# Patient Record
Sex: Male | Born: 1955 | Race: White | Hispanic: No | Marital: Married | Smoking: Current every day smoker
Health system: Southern US, Community
[De-identification: ages and names within clinical notes are randomized; demographics above are authoritative.]

## PROBLEM LIST (undated history)

## (undated) DIAGNOSIS — R0602 Shortness of breath: Secondary | ICD-10-CM

## (undated) DIAGNOSIS — G459 Transient cerebral ischemic attack, unspecified: Secondary | ICD-10-CM

## (undated) DIAGNOSIS — I219 Acute myocardial infarction, unspecified: Secondary | ICD-10-CM

## (undated) DIAGNOSIS — I639 Cerebral infarction, unspecified: Secondary | ICD-10-CM

## (undated) HISTORY — PX: OTHER SURGICAL HISTORY: SHX169

## (undated) HISTORY — DX: Transient cerebral ischemic attack, unspecified: G45.9

## (undated) HISTORY — PX: CORONARY ANGIOPLASTY WITH STENT PLACEMENT: SHX49

## (undated) HISTORY — DX: Cerebral infarction, unspecified: I63.9

## (undated) HISTORY — DX: Shortness of breath: R06.02

## (undated) HISTORY — DX: Acute myocardial infarction, unspecified: I21.9

## (undated) HISTORY — PX: CAROTID STENT: SHX1301

## (undated) HISTORY — PX: CATARACT EXTRACTION: SUR2

---

## 2004-05-18 ENCOUNTER — Ambulatory Visit: Payer: Self-pay | Admitting: Cardiology

## 2004-05-18 ENCOUNTER — Inpatient Hospital Stay (HOSPITAL_COMMUNITY): Admission: AD | Admit: 2004-05-18 | Discharge: 2004-05-21 | Payer: Self-pay | Admitting: Cardiology

## 2005-12-17 ENCOUNTER — Encounter: Admission: RE | Admit: 2005-12-17 | Discharge: 2005-12-17 | Payer: Self-pay | Admitting: Orthopaedic Surgery

## 2006-01-06 ENCOUNTER — Encounter: Admission: RE | Admit: 2006-01-06 | Discharge: 2006-01-06 | Payer: Self-pay | Admitting: Orthopaedic Surgery

## 2006-10-19 ENCOUNTER — Ambulatory Visit: Payer: Self-pay | Admitting: Vascular Surgery

## 2010-11-13 NOTE — Cardiovascular Report (Signed)
NAME:  Eddie Jones, Eddie Jones NO.:  [id number]   MEDICAL RECORD NO.:  [id number]          PATIENT TYPE:  INP   LOCATION:  2920                         FACILITY:  [facility]   PHYSICIAN:  Arturo Morton. Riley Kill, M.D. [facility] OF BIRTH:  1955/08/11   DATE OF PROCEDURE:  05/19/2004  DATE OF DISCHARGE:                              CARDIAC CATHETERIZATION   INDICATIONS FOR PROCEDURE:  [name] is a 55 year old gentleman who has had  a prior CVA.  He presented with a non-ST elevation myocardial infarction.  The EKG was not definitive.  There was some ST elevation in V3 through V5,  but it appears that the limb leads were not with normal precordial  progression.  Nonetheless, enzymes were positive and he was brought to the  laboratory for further evaluation.  He previously underwent catheterization  which apparently demonstrated a 99% distal diagonal and a small vessel, a  30% ramus, a 75% proximal circumflex, a 50% mid right, and a 30% PDA.  He  now presents with ongoing chest pain.  The patient is on a variety of  medications including Lopressor, Ecotrin, Protonix, Norvasc, Plavix, and  Lipitor.   PROCEDURE:  1.  Left heart catheterization.  2.  Selective coronary arteriography.  3.  Selective left ventriculography.  4.  Percutaneous transluminal coronary angioplasty and stenting of the      diagonal.  5.  Stenting of the left anterior descending.   DESCRIPTION OF PROCEDURE:  The patient was brought to the catheterization  laboratory and prepped and draped in the usual fashion.  Through an anterior  puncture the right femoral artery was entered.  A 6 French sheath was  placed.  Views of the right and left coronary arteries were obtained in  multiple angiographic projections.  Central aortic and left ventricular  pressures were measured with pigtail.  Ventriculography was performed in the  RAO projection.  Ventriculography was also performed in the LAO projection.  I then called Dr.  Loraine Leriche Pulsipher and he came over to the laboratory and we  reviewed the films carefully.  The AV circumflex was not a favorable vessel  for intervention, and it was really quite small.  There was a sub-branch of  the diagonal that was subtotally occluded and leading into this bifurcation  was a fairly high-grade lesion.  There was a slightly hazy lesion in the mid  left anterior descending artery about 70% as well.  The RCA has about 50%  mid-narrowing.  After review of the films, Dr. Gerri Spore and I felt that we  should try to open the diagonal.  It was also felt that the LAD should be  stented.  The patient was on an Integrilin drip and was given heparin to  prolong the ACT to over 200.  I spoke with the patient's family and with the  patient about potential options, and we decided to proceed on.  Risks were  reviewed.  We used a 7 [place] sheath and chose a 7 [place] JL35 Cortis guide.  We tried to get into the medial branch of the sub-branch of the  diagonal,  but were unable to do so, but were able to get a wire down the larger of the  two branches.  We then pre-dilated with a 1.5 and then subsequently with a 2  mm balloon.  There was some improvement, but the vessel developed some re-  narrowing.  We did some long inflations and this improved the appearance of  the artery.  Attention was then turned to the LAD, and we directly stented  that with a 2.5 x 18 Cypher drug-eluding stent.  There was marked  improvement of the appearance of the artery, and then we post-dilated with a  3 mm Power Sail throughout the course of the stent.  Because there appeared  to be some re-narrowing in the diagonal, we recrossed into the diagonal.  Multiple attempts to get into the second sub-branch were unsuccessful  because of the marked angulation and there was slow flow into this to begin  with, and this was the previously described vessel.  We re-dilated, but the  vessel still did not look good, so we  elected to go ahead and place a 2 mm  stent across this area, and the 2 mm stent resulted in a marked improvement  in the appearance of the artery.  The 2 mm miny vision was taken up to about  11 atmospheres.  Because this was over-sized, it was not post-dilated.  All  catheters were subsequently removed.  Because of some oozing around the  sheath, the 7 French sheath was upgraded to an 8 [place] sheath.  ACT's were  checked throughout the course of the procedure and were satisfactory.  Intermittent heparin boluses were given to maintain an ACT between 200 and  300 seconds on eptifibatide.  The procedure was completed.  The patient was  taken to the holding area in satisfactory clinical condition.   HEMODYNAMIC DATA:  1.  Central aorta 168/100, mean 126.  2.  Left ventricle 165/33.  3.  No gradient on pull-back across the aortic valve.   ANGIOGRAPHIC DATA:  1.  Ventriculography was performed in the RAO projection.  Overall systolic      function was preserved.  In the LAO projection likewise.  No definite      wall motion abnormality was seen.  2.  The left main demonstrates about 20% ostial narrowing, but this does not      appear to be at all high-grade.  3.  The left anterior descending courses to the apex after the major      diagonal take-off, there is an area of haziness with 70% segmental      plaquing.  In some views, it looks perhaps like it could be tighter,      although it is not clear that this represents the culprit.  Following      stenting with a Cypher stent this was reduced to 0%.  The Cypher stent      was post-dilated with a 3 mm Power Sail balloon up to about 12 to 14      atmospheres, so therefore there should be an excellent angiographic      appearance.  The distal left anterior descending was widely patent.  4.  The proximal diagonal has diffuse 30 to 40% narrowing throughout.  It      then bifurcates into a larger branch that has about a 50% area of      narrowing.  The next branch then also bifurcates again into a sub-branch  that is subtotally occluded and another branch that has about 80 to 90%      narrowing as well.  We stented across the larger branch, was stented      across the more severely diseased branch with reduction stenosis from      about 80% to 0%.  This appeared excellent.  5.  There is a large ramus intermedius vessel that has about 30% mid-      narrowing.  There is an AV circumflex that supplies a very small amount      of lateral myocardium with diffuse 80% narrowing.  There is a steep      angulation from the left main making this unfavorable for intervention.  6.  The right coronary artery is a fairly large caliber vessel with about a      50% area of mid-narrowing.  The posterior descending artery and      posterior lateral branch without critical disease.   CONCLUSION:  1.  Well preserved left ventricular function.  2.  Non-ST elevation myocardial infarction.  3.  Successful dilatation of a diagonal sub-branch without change in the      subtotally occluded second sub-branch which we did not cross.  4.  Successful stenting of the left anterior descending artery with a drug-      eluding stent as described above.   DISPOSITION:  The patient does have residual disease.  He smokes.  He also  has very poor dentition.  All of these things will need to be taken care of.  He will need to maintain treatment with statin's as well as aspirin and  Plavix.  Close followup with Dr. Tomie China will be recommended.       TDS/MEDQ  D:  05/19/2004  T:  05/19/2004  Job:  [id number]   cc:   [place] Bing, M.D.   Aundra Dubin. Revankar, M.D.  [address]  [place]  [place] [address]  Fax: [phone]   [facility]

## 2010-11-13 NOTE — H&P (Signed)
NAME:  TIANDRE, TEALL NO.:  [id number]   MEDICAL RECORD NO.:  [id number]          PATIENT TYPE:  INP   LOCATION:  2920                         FACILITY:  [facility]   PHYSICIAN:  [place] Bing, M.D.  DATE OF BIRTH:  10/29/1955   DATE OF ADMISSION:  05/18/2004  DATE OF DISCHARGE:                                HISTORY & PHYSICAL   REFERRING PHYSICIAN:  Dr. Aundra Dubin. Revankar   HISTORY OF PRESENT ILLNESS:  A 55 year old gentleman with relatively mild  coronary disease with catheterization in July 2002 and now presents with  severe chest discomfort with ischemic quality EKG abnormalities. Mr. Mcdonagh  has a history of hypertension that has been apparently been relatively well  controlled. He is cigarette smoker with current consumption of two packs per  day. He suffered a CVA approximately three years ago resulting in transient  right-sided weakness and numbness as well as dysarthria. His dominant hand  is his left. He has done well with occasional sharp transitory chest  discomfort since his previous catheterization until approximately 2 p.m.  today when he experienced the sudden onset at rest of severe pressure  radiating to the left arm. This has gradually increased in intensity since  he sought medical attention some hours ago, currently at a level of 1/10.  He had associated, but no diaphoresis nor dyspnea. He was seen at [facility] were two EKGs were relatively unremarkable, but the third showed  anteroseptal ST-segment elevation with loss of R-waves. On a repeat tracing  here, his EKG is back to baseline.   Cardiac catheterization performed at [facility] on January 04, 2004,  revealed normal left ventricular function. There was a 99% stenosis in the  small branch of the first diagonal vessel as well as a 75% proximal  circumflex lesion, 30% lesion in a ramus, 50% mid RCA lesion, and a 30% PDA  stenosis.   PAST MEDICAL HISTORY:  Otherwise unremarkable.  The patient reports no  allergies. His recent medications have included atenolol, amlodipine,  clopidogrel, and aspirin.   SOCIAL HISTORY:  Lives in [place]; married; works as a Soil scientist. A 60-pack-  year history of cigarette smoking. Denies excessive use of alcohol.   FAMILY HISTORY:  Mother died of myocardial infarction at age 36.  He has  three brothers who have undergone CABG surgery.   REVIEW OF SYSTEMS:  Notable for low back pain.  He has occasional headaches.  Otherwise, all systems are reviewed and are negative.   PHYSICAL EXAMINATION:  GENERAL: A pleasant gentleman with somewhat difficult  to understand speech, in no acute distress.  VITAL SIGNS: The heart rate is 72 and regular, respirations 16, blood  pressure 150/80. O2 saturation 100% on two liters.  HEENT: Anicteric sclera; normal lids and conjunctiva; pupils equal, round,  and reactive to light; EOMs full.  NECK:  No jugular venous distention; no carotid bruits.  ENDOCRINE: No thyromegaly.  HEMATOPOIETIC: No cervical, axillary, or inguinal adenopathy.  LUNGS: A few scattered rhonchi; coarse breath sounds at both bases.  CARDIAC: Normal first heart sound. Increased aortic component in  the second  heart sound; minimal systolic murmur; soft fourth heart sound present.  ABDOMEN: Soft and nontender; no organomegaly; normal bowel sounds; no  bruits; aortic pulsations not palpable.  NEUROMUSCULAR: Symmetric strength and tone; normal cranial nerves; normal  HIF.  MUSCULOSKELETAL: No joint deformities.   Chest x-ray: Under penetrated; no abnormalities detected by report.   EKG: At present, normal sinus rhythm; J-point elevation in V2 and V3, which  is unchanged from a prior tracing.  Prominent voltage; otherwise normal.   LABORATORY DATA:  Other laboratory notable for white count of 12,500, normal  renal function, glucose 141, normal initial markers.   IMPRESSION:  A 55 year old gentleman with no coronary disease  presenting  with impressive symptoms and at least one EKG consistent with transmural  ischemia. Initial treatment will be with continuation of beta blocker,  aspirin, and clopidogrel. He has already received Lovenox, which will also  be continued. Integrilin will be added. Unless he has recurrent symptoms,  coronary angiography will be performed tomorrow. We will attempt to  encourage him to stop smoking. Lipid profile will be assessed. Hypertension  will be optimally controlled.      [place]   RR/MEDQ  D:  05/18/2004  T:  05/18/2004  Job:  [id number]

## 2010-11-13 NOTE — Discharge Summary (Signed)
NAME:  Eddie Jones, Eddie Jones NO.:  [id number]   MEDICAL RECORD NO.:  [id number]          PATIENT TYPE:  INP   LOCATION:  2015                         FACILITY:  [facility]   PHYSICIAN:  [place] Bing, M.D.  DATE OF BIRTH:  05-28-1956   DATE OF ADMISSION:  05/18/2004  DATE OF DISCHARGE:  05/21/2004                           DISCHARGE SUMMARY - REFERRING   DISCHARGE DIAGNOSES:  1.  Non ST-segment elevated myocardial infarction status post percutaneous      coronary intervention with a drug-eluding stent to the left anterior      descending and status post percutaneous coronary intervention with a      bare-metal stent to the diagonal.  2.  Hypertension, treated.  3.  Hyperlipidemia, treated.  4.  History of cerebrovascular accident in 2002.  5.  Diabetes mellitus, dietary recommendations given to the patient.  6.  Nonsustained ventricular tachycardia, resolved.   HOSPITAL COURSE:  Eddie Jones is a 55 year old male patient who developed  substernal chest pain at 2 p.m. on the day of admission.  The chest pain did  radiate into his left arm.  He did have seral EKGs and the third EKG  revealed an anteroseptal ST-segment elevation with loss of R-waves but a  repeat tracing upon arrival at [facility] revealed that this had  resolved.  The patient again was transferred to [facility] from [facility] and ultimately underwent an emergent cardiac catheterization for  recurrent chest pain.  He was found to have a 70% LAD lesion and a drug-  eluding stent was placed to that lesion.  He also had a 90% diagonal  stenosis and a nondrug-eluding stent was placed into this small vessel.  The  diagonal was stented across a severely diseased subbranch and no EKG changes  occurred.  A small AV circumflex had an 80% lesion while the mid circumflex  had an 30% stenosis.  These areas were treated medically.  There was a 50%  RCA lesion as well.   During the patient's  hospitalization, he also underwent carotid Dopplers  which revealed no right ICA stenosis.  There was a left 60-80% ICA stenosis  and this was felt to be on the lower end of the scale as far as stenoses.   The patient was treated aggressively with medical therapy.  Lab studies  during the stay revealed a total cholesterol of 219, triglycerides 207, HDL  27, LDL 152.  BUN 5, creatinine 0.9, potassium 3.6, sodium 136, white count  11.6, hemoglobin 13, hematocrit 38.3, platelets 298, hemoglobin A1C 6.3  (normal range 4.6 to 6.1), maximum CK was 374 with 28 MB fractions.  Maximum  troponin was 5.24.   The patient was discharged to home on May 22, 2004 in a sable condition  on the following medications:  Toprol-XL 100 mg a day, chlorthalidone 12.5  mg a day, coated aspirin 325 mg a day, Norvasc 10 mg a day, Plavix 75 mg a  day, Lipitor 40 mg q.h.s., Wellbutrin 150 mg tablets daily until May 23, 2004 and then he goes on a b.i.d.  dose.  May utilize sublingual  nitroglycerin p.r.n. chest pain.  No straining or lifting over 10 pounds for  1 week.  Remain on a low fat, diabetic diet.  No work until cleared by Dr. Tomie China.  No smoking.  Clean over cath site with soap and water, no scrubbing.  Call  for questions or concerns at [phone].  We have asked the patient to follow  up with Dr. Tomie China and to make an appointment to see him in 2 weeks.      Larita Fife   LB/MEDQ  D:  05/21/2004  T:  05/21/2004  Job:  [id number]   cc:   Aundra Dubin. Revankar, M.D.  [address]  [place]  [place] [address]  Fax: [phone]   Dr. Beecher Mcardle

## 2011-08-05 DIAGNOSIS — E119 Type 2 diabetes mellitus without complications: Secondary | ICD-10-CM | POA: Diagnosis not present

## 2011-08-05 DIAGNOSIS — Z79899 Other long term (current) drug therapy: Secondary | ICD-10-CM | POA: Diagnosis not present

## 2011-08-05 DIAGNOSIS — I2510 Atherosclerotic heart disease of native coronary artery without angina pectoris: Secondary | ICD-10-CM | POA: Diagnosis not present

## 2011-08-05 DIAGNOSIS — E785 Hyperlipidemia, unspecified: Secondary | ICD-10-CM | POA: Diagnosis not present

## 2011-08-05 DIAGNOSIS — I10 Essential (primary) hypertension: Secondary | ICD-10-CM | POA: Diagnosis not present

## 2011-08-05 DIAGNOSIS — Z6836 Body mass index (BMI) 36.0-36.9, adult: Secondary | ICD-10-CM | POA: Diagnosis not present

## 2011-09-16 DIAGNOSIS — R0602 Shortness of breath: Secondary | ICD-10-CM | POA: Diagnosis not present

## 2011-09-16 DIAGNOSIS — R079 Chest pain, unspecified: Secondary | ICD-10-CM | POA: Diagnosis not present

## 2011-09-16 DIAGNOSIS — E785 Hyperlipidemia, unspecified: Secondary | ICD-10-CM | POA: Diagnosis not present

## 2011-09-16 DIAGNOSIS — J449 Chronic obstructive pulmonary disease, unspecified: Secondary | ICD-10-CM | POA: Diagnosis not present

## 2011-09-16 DIAGNOSIS — I10 Essential (primary) hypertension: Secondary | ICD-10-CM | POA: Diagnosis not present

## 2011-09-16 DIAGNOSIS — F17200 Nicotine dependence, unspecified, uncomplicated: Secondary | ICD-10-CM | POA: Diagnosis not present

## 2011-09-16 DIAGNOSIS — I252 Old myocardial infarction: Secondary | ICD-10-CM | POA: Diagnosis not present

## 2011-09-16 DIAGNOSIS — Z8673 Personal history of transient ischemic attack (TIA), and cerebral infarction without residual deficits: Secondary | ICD-10-CM | POA: Diagnosis not present

## 2011-09-22 DIAGNOSIS — E119 Type 2 diabetes mellitus without complications: Secondary | ICD-10-CM | POA: Diagnosis not present

## 2011-09-22 DIAGNOSIS — I739 Peripheral vascular disease, unspecified: Secondary | ICD-10-CM | POA: Diagnosis not present

## 2011-09-22 DIAGNOSIS — I10 Essential (primary) hypertension: Secondary | ICD-10-CM | POA: Diagnosis not present

## 2011-09-22 DIAGNOSIS — I209 Angina pectoris, unspecified: Secondary | ICD-10-CM | POA: Diagnosis not present

## 2011-09-23 DIAGNOSIS — M545 Low back pain: Secondary | ICD-10-CM | POA: Diagnosis not present

## 2011-09-23 DIAGNOSIS — G894 Chronic pain syndrome: Secondary | ICD-10-CM | POA: Diagnosis not present

## 2011-11-05 DIAGNOSIS — I6529 Occlusion and stenosis of unspecified carotid artery: Secondary | ICD-10-CM | POA: Diagnosis not present

## 2011-11-10 DIAGNOSIS — I10 Essential (primary) hypertension: Secondary | ICD-10-CM | POA: Diagnosis not present

## 2011-11-10 DIAGNOSIS — E785 Hyperlipidemia, unspecified: Secondary | ICD-10-CM | POA: Diagnosis not present

## 2011-11-10 DIAGNOSIS — E119 Type 2 diabetes mellitus without complications: Secondary | ICD-10-CM | POA: Diagnosis not present

## 2011-11-10 DIAGNOSIS — Z6835 Body mass index (BMI) 35.0-35.9, adult: Secondary | ICD-10-CM | POA: Diagnosis not present

## 2011-12-06 DIAGNOSIS — G894 Chronic pain syndrome: Secondary | ICD-10-CM | POA: Diagnosis not present

## 2012-01-04 DIAGNOSIS — F17200 Nicotine dependence, unspecified, uncomplicated: Secondary | ICD-10-CM | POA: Diagnosis not present

## 2012-01-04 DIAGNOSIS — I6529 Occlusion and stenosis of unspecified carotid artery: Secondary | ICD-10-CM | POA: Diagnosis not present

## 2012-01-06 DIAGNOSIS — M545 Low back pain: Secondary | ICD-10-CM | POA: Diagnosis not present

## 2012-01-06 DIAGNOSIS — G894 Chronic pain syndrome: Secondary | ICD-10-CM | POA: Diagnosis not present

## 2012-05-08 DIAGNOSIS — I658 Occlusion and stenosis of other precerebral arteries: Secondary | ICD-10-CM | POA: Diagnosis not present

## 2012-05-11 DIAGNOSIS — G894 Chronic pain syndrome: Secondary | ICD-10-CM | POA: Diagnosis not present

## 2012-05-11 DIAGNOSIS — M545 Low back pain: Secondary | ICD-10-CM | POA: Diagnosis not present

## 2012-06-14 DIAGNOSIS — E119 Type 2 diabetes mellitus without complications: Secondary | ICD-10-CM | POA: Diagnosis not present

## 2012-06-14 DIAGNOSIS — Z79899 Other long term (current) drug therapy: Secondary | ICD-10-CM | POA: Diagnosis not present

## 2012-06-14 DIAGNOSIS — R079 Chest pain, unspecified: Secondary | ICD-10-CM | POA: Diagnosis not present

## 2012-06-14 DIAGNOSIS — E785 Hyperlipidemia, unspecified: Secondary | ICD-10-CM | POA: Diagnosis not present

## 2012-06-14 DIAGNOSIS — I6529 Occlusion and stenosis of unspecified carotid artery: Secondary | ICD-10-CM | POA: Diagnosis not present

## 2012-06-14 DIAGNOSIS — I10 Essential (primary) hypertension: Secondary | ICD-10-CM | POA: Diagnosis not present

## 2012-06-14 DIAGNOSIS — G894 Chronic pain syndrome: Secondary | ICD-10-CM | POA: Diagnosis not present

## 2012-06-14 DIAGNOSIS — I2510 Atherosclerotic heart disease of native coronary artery without angina pectoris: Secondary | ICD-10-CM | POA: Diagnosis not present

## 2012-06-16 DIAGNOSIS — F411 Generalized anxiety disorder: Secondary | ICD-10-CM | POA: Diagnosis not present

## 2012-06-16 DIAGNOSIS — I10 Essential (primary) hypertension: Secondary | ICD-10-CM | POA: Diagnosis not present

## 2012-06-16 DIAGNOSIS — I2510 Atherosclerotic heart disease of native coronary artery without angina pectoris: Secondary | ICD-10-CM | POA: Diagnosis not present

## 2012-06-16 DIAGNOSIS — E119 Type 2 diabetes mellitus without complications: Secondary | ICD-10-CM | POA: Diagnosis not present

## 2012-06-30 DIAGNOSIS — G894 Chronic pain syndrome: Secondary | ICD-10-CM | POA: Diagnosis not present

## 2012-07-12 DIAGNOSIS — I2510 Atherosclerotic heart disease of native coronary artery without angina pectoris: Secondary | ICD-10-CM | POA: Diagnosis not present

## 2012-07-12 DIAGNOSIS — E119 Type 2 diabetes mellitus without complications: Secondary | ICD-10-CM | POA: Diagnosis not present

## 2012-07-12 DIAGNOSIS — E785 Hyperlipidemia, unspecified: Secondary | ICD-10-CM | POA: Diagnosis not present

## 2012-09-18 DIAGNOSIS — G894 Chronic pain syndrome: Secondary | ICD-10-CM | POA: Diagnosis not present

## 2012-09-18 DIAGNOSIS — M545 Low back pain: Secondary | ICD-10-CM | POA: Diagnosis not present

## 2012-09-21 DIAGNOSIS — M48061 Spinal stenosis, lumbar region without neurogenic claudication: Secondary | ICD-10-CM | POA: Diagnosis not present

## 2012-09-21 DIAGNOSIS — M545 Low back pain: Secondary | ICD-10-CM | POA: Diagnosis not present

## 2012-09-21 DIAGNOSIS — M5126 Other intervertebral disc displacement, lumbar region: Secondary | ICD-10-CM | POA: Diagnosis not present

## 2012-10-03 DIAGNOSIS — E785 Hyperlipidemia, unspecified: Secondary | ICD-10-CM | POA: Diagnosis not present

## 2012-10-03 DIAGNOSIS — M5380 Other specified dorsopathies, site unspecified: Secondary | ICD-10-CM | POA: Diagnosis not present

## 2012-10-03 DIAGNOSIS — E119 Type 2 diabetes mellitus without complications: Secondary | ICD-10-CM | POA: Diagnosis not present

## 2012-10-03 DIAGNOSIS — I10 Essential (primary) hypertension: Secondary | ICD-10-CM | POA: Diagnosis not present

## 2012-10-17 DIAGNOSIS — M48061 Spinal stenosis, lumbar region without neurogenic claudication: Secondary | ICD-10-CM | POA: Diagnosis not present

## 2012-10-17 DIAGNOSIS — M545 Low back pain: Secondary | ICD-10-CM | POA: Diagnosis not present

## 2012-10-17 DIAGNOSIS — G894 Chronic pain syndrome: Secondary | ICD-10-CM | POA: Diagnosis not present

## 2012-11-20 DIAGNOSIS — I658 Occlusion and stenosis of other precerebral arteries: Secondary | ICD-10-CM | POA: Diagnosis not present

## 2012-12-05 DIAGNOSIS — I6529 Occlusion and stenosis of unspecified carotid artery: Secondary | ICD-10-CM | POA: Diagnosis not present

## 2012-12-05 DIAGNOSIS — I2510 Atherosclerotic heart disease of native coronary artery without angina pectoris: Secondary | ICD-10-CM | POA: Diagnosis not present

## 2012-12-05 DIAGNOSIS — M545 Low back pain: Secondary | ICD-10-CM | POA: Diagnosis not present

## 2012-12-05 DIAGNOSIS — F17200 Nicotine dependence, unspecified, uncomplicated: Secondary | ICD-10-CM | POA: Diagnosis not present

## 2013-01-04 DIAGNOSIS — I10 Essential (primary) hypertension: Secondary | ICD-10-CM | POA: Diagnosis not present

## 2013-01-04 DIAGNOSIS — S46819A Strain of other muscles, fascia and tendons at shoulder and upper arm level, unspecified arm, initial encounter: Secondary | ICD-10-CM | POA: Diagnosis not present

## 2013-01-04 DIAGNOSIS — Z8673 Personal history of transient ischemic attack (TIA), and cerebral infarction without residual deficits: Secondary | ICD-10-CM | POA: Diagnosis not present

## 2013-01-04 DIAGNOSIS — F17200 Nicotine dependence, unspecified, uncomplicated: Secondary | ICD-10-CM | POA: Diagnosis not present

## 2013-01-04 DIAGNOSIS — S43499A Other sprain of unspecified shoulder joint, initial encounter: Secondary | ICD-10-CM | POA: Diagnosis not present

## 2013-01-04 DIAGNOSIS — E7800 Pure hypercholesterolemia, unspecified: Secondary | ICD-10-CM | POA: Diagnosis not present

## 2013-01-19 DIAGNOSIS — G894 Chronic pain syndrome: Secondary | ICD-10-CM | POA: Diagnosis not present

## 2013-01-29 DIAGNOSIS — M545 Low back pain: Secondary | ICD-10-CM | POA: Diagnosis not present

## 2013-01-29 DIAGNOSIS — M48061 Spinal stenosis, lumbar region without neurogenic claudication: Secondary | ICD-10-CM | POA: Diagnosis not present

## 2013-01-29 DIAGNOSIS — G894 Chronic pain syndrome: Secondary | ICD-10-CM | POA: Diagnosis not present

## 2013-03-16 DIAGNOSIS — E785 Hyperlipidemia, unspecified: Secondary | ICD-10-CM | POA: Diagnosis not present

## 2013-03-16 DIAGNOSIS — I2510 Atherosclerotic heart disease of native coronary artery without angina pectoris: Secondary | ICD-10-CM | POA: Diagnosis not present

## 2013-03-16 DIAGNOSIS — D485 Neoplasm of uncertain behavior of skin: Secondary | ICD-10-CM | POA: Diagnosis not present

## 2013-03-16 DIAGNOSIS — E119 Type 2 diabetes mellitus without complications: Secondary | ICD-10-CM | POA: Diagnosis not present

## 2013-05-21 DIAGNOSIS — I658 Occlusion and stenosis of other precerebral arteries: Secondary | ICD-10-CM | POA: Diagnosis not present

## 2013-07-12 DIAGNOSIS — M545 Low back pain, unspecified: Secondary | ICD-10-CM | POA: Diagnosis not present

## 2013-09-26 DIAGNOSIS — G894 Chronic pain syndrome: Secondary | ICD-10-CM | POA: Diagnosis not present

## 2013-09-26 DIAGNOSIS — M545 Low back pain, unspecified: Secondary | ICD-10-CM | POA: Diagnosis not present

## 2013-09-26 DIAGNOSIS — M48061 Spinal stenosis, lumbar region without neurogenic claudication: Secondary | ICD-10-CM | POA: Diagnosis not present

## 2013-10-25 DIAGNOSIS — H25049 Posterior subcapsular polar age-related cataract, unspecified eye: Secondary | ICD-10-CM | POA: Diagnosis not present

## 2013-10-25 DIAGNOSIS — IMO0002 Reserved for concepts with insufficient information to code with codable children: Secondary | ICD-10-CM | POA: Diagnosis not present

## 2013-10-26 DIAGNOSIS — I658 Occlusion and stenosis of other precerebral arteries: Secondary | ICD-10-CM | POA: Diagnosis not present

## 2013-10-31 DIAGNOSIS — H2589 Other age-related cataract: Secondary | ICD-10-CM | POA: Diagnosis not present

## 2013-10-31 DIAGNOSIS — H2510 Age-related nuclear cataract, unspecified eye: Secondary | ICD-10-CM | POA: Diagnosis not present

## 2013-10-31 DIAGNOSIS — Z961 Presence of intraocular lens: Secondary | ICD-10-CM | POA: Diagnosis not present

## 2013-10-31 DIAGNOSIS — Z9849 Cataract extraction status, unspecified eye: Secondary | ICD-10-CM | POA: Diagnosis not present

## 2013-11-07 DIAGNOSIS — H26499 Other secondary cataract, unspecified eye: Secondary | ICD-10-CM | POA: Diagnosis not present

## 2013-11-07 DIAGNOSIS — H2589 Other age-related cataract: Secondary | ICD-10-CM | POA: Diagnosis not present

## 2013-11-07 DIAGNOSIS — Z961 Presence of intraocular lens: Secondary | ICD-10-CM | POA: Diagnosis not present

## 2013-11-07 DIAGNOSIS — Z9849 Cataract extraction status, unspecified eye: Secondary | ICD-10-CM | POA: Diagnosis not present

## 2013-11-07 DIAGNOSIS — H2510 Age-related nuclear cataract, unspecified eye: Secondary | ICD-10-CM | POA: Diagnosis not present

## 2013-11-26 DIAGNOSIS — R209 Unspecified disturbances of skin sensation: Secondary | ICD-10-CM | POA: Diagnosis not present

## 2013-11-26 DIAGNOSIS — I10 Essential (primary) hypertension: Secondary | ICD-10-CM | POA: Diagnosis not present

## 2013-11-26 DIAGNOSIS — F17200 Nicotine dependence, unspecified, uncomplicated: Secondary | ICD-10-CM | POA: Diagnosis not present

## 2013-11-26 DIAGNOSIS — E785 Hyperlipidemia, unspecified: Secondary | ICD-10-CM | POA: Diagnosis not present

## 2013-11-26 DIAGNOSIS — E119 Type 2 diabetes mellitus without complications: Secondary | ICD-10-CM | POA: Diagnosis not present

## 2013-11-26 DIAGNOSIS — I63239 Cerebral infarction due to unspecified occlusion or stenosis of unspecified carotid arteries: Secondary | ICD-10-CM | POA: Diagnosis not present

## 2013-11-28 DIAGNOSIS — Z125 Encounter for screening for malignant neoplasm of prostate: Secondary | ICD-10-CM | POA: Diagnosis not present

## 2013-11-28 DIAGNOSIS — E785 Hyperlipidemia, unspecified: Secondary | ICD-10-CM | POA: Diagnosis not present

## 2013-11-28 DIAGNOSIS — E119 Type 2 diabetes mellitus without complications: Secondary | ICD-10-CM | POA: Diagnosis not present

## 2013-11-28 DIAGNOSIS — I10 Essential (primary) hypertension: Secondary | ICD-10-CM | POA: Diagnosis not present

## 2013-11-28 DIAGNOSIS — F411 Generalized anxiety disorder: Secondary | ICD-10-CM | POA: Diagnosis not present

## 2013-11-28 DIAGNOSIS — I2510 Atherosclerotic heart disease of native coronary artery without angina pectoris: Secondary | ICD-10-CM | POA: Diagnosis not present

## 2013-12-05 DIAGNOSIS — I209 Angina pectoris, unspecified: Secondary | ICD-10-CM | POA: Diagnosis not present

## 2013-12-05 DIAGNOSIS — E785 Hyperlipidemia, unspecified: Secondary | ICD-10-CM | POA: Diagnosis not present

## 2013-12-05 DIAGNOSIS — E119 Type 2 diabetes mellitus without complications: Secondary | ICD-10-CM | POA: Diagnosis not present

## 2013-12-05 DIAGNOSIS — F17200 Nicotine dependence, unspecified, uncomplicated: Secondary | ICD-10-CM | POA: Diagnosis not present

## 2013-12-05 DIAGNOSIS — I10 Essential (primary) hypertension: Secondary | ICD-10-CM | POA: Diagnosis not present

## 2013-12-05 DIAGNOSIS — I6529 Occlusion and stenosis of unspecified carotid artery: Secondary | ICD-10-CM | POA: Diagnosis not present

## 2013-12-05 DIAGNOSIS — R209 Unspecified disturbances of skin sensation: Secondary | ICD-10-CM | POA: Diagnosis not present

## 2013-12-05 DIAGNOSIS — I2510 Atherosclerotic heart disease of native coronary artery without angina pectoris: Secondary | ICD-10-CM | POA: Diagnosis not present

## 2013-12-07 DIAGNOSIS — R209 Unspecified disturbances of skin sensation: Secondary | ICD-10-CM | POA: Diagnosis present

## 2013-12-07 DIAGNOSIS — I252 Old myocardial infarction: Secondary | ICD-10-CM | POA: Diagnosis not present

## 2013-12-07 DIAGNOSIS — E119 Type 2 diabetes mellitus without complications: Secondary | ICD-10-CM | POA: Diagnosis present

## 2013-12-07 DIAGNOSIS — I2510 Atherosclerotic heart disease of native coronary artery without angina pectoris: Secondary | ICD-10-CM | POA: Diagnosis present

## 2013-12-07 DIAGNOSIS — F17200 Nicotine dependence, unspecified, uncomplicated: Secondary | ICD-10-CM | POA: Diagnosis present

## 2013-12-07 DIAGNOSIS — I6529 Occlusion and stenosis of unspecified carotid artery: Secondary | ICD-10-CM | POA: Diagnosis not present

## 2013-12-07 DIAGNOSIS — M545 Low back pain, unspecified: Secondary | ICD-10-CM | POA: Diagnosis present

## 2013-12-07 DIAGNOSIS — M1990 Unspecified osteoarthritis, unspecified site: Secondary | ICD-10-CM | POA: Diagnosis present

## 2013-12-07 DIAGNOSIS — E785 Hyperlipidemia, unspecified: Secondary | ICD-10-CM | POA: Diagnosis present

## 2013-12-07 DIAGNOSIS — G894 Chronic pain syndrome: Secondary | ICD-10-CM | POA: Diagnosis present

## 2013-12-07 DIAGNOSIS — M48061 Spinal stenosis, lumbar region without neurogenic claudication: Secondary | ICD-10-CM | POA: Diagnosis present

## 2013-12-07 DIAGNOSIS — I209 Angina pectoris, unspecified: Secondary | ICD-10-CM | POA: Diagnosis present

## 2013-12-07 DIAGNOSIS — I10 Essential (primary) hypertension: Secondary | ICD-10-CM | POA: Diagnosis present

## 2014-01-10 DIAGNOSIS — G894 Chronic pain syndrome: Secondary | ICD-10-CM | POA: Diagnosis not present

## 2014-01-28 DIAGNOSIS — G894 Chronic pain syndrome: Secondary | ICD-10-CM | POA: Diagnosis not present

## 2014-01-28 DIAGNOSIS — M545 Low back pain, unspecified: Secondary | ICD-10-CM | POA: Diagnosis not present

## 2014-01-28 DIAGNOSIS — M48061 Spinal stenosis, lumbar region without neurogenic claudication: Secondary | ICD-10-CM | POA: Diagnosis not present

## 2014-01-28 DIAGNOSIS — Z79899 Other long term (current) drug therapy: Secondary | ICD-10-CM | POA: Diagnosis not present

## 2014-03-05 DIAGNOSIS — I2510 Atherosclerotic heart disease of native coronary artery without angina pectoris: Secondary | ICD-10-CM | POA: Diagnosis not present

## 2014-03-05 DIAGNOSIS — E785 Hyperlipidemia, unspecified: Secondary | ICD-10-CM | POA: Diagnosis not present

## 2014-03-05 DIAGNOSIS — M545 Low back pain, unspecified: Secondary | ICD-10-CM | POA: Diagnosis not present

## 2014-03-05 DIAGNOSIS — I10 Essential (primary) hypertension: Secondary | ICD-10-CM | POA: Diagnosis not present

## 2014-03-05 DIAGNOSIS — E119 Type 2 diabetes mellitus without complications: Secondary | ICD-10-CM | POA: Diagnosis not present

## 2014-06-05 DIAGNOSIS — E785 Hyperlipidemia, unspecified: Secondary | ICD-10-CM | POA: Diagnosis not present

## 2014-06-05 DIAGNOSIS — I10 Essential (primary) hypertension: Secondary | ICD-10-CM | POA: Diagnosis not present

## 2014-06-05 DIAGNOSIS — I2510 Atherosclerotic heart disease of native coronary artery without angina pectoris: Secondary | ICD-10-CM | POA: Diagnosis not present

## 2014-06-05 DIAGNOSIS — F419 Anxiety disorder, unspecified: Secondary | ICD-10-CM | POA: Diagnosis not present

## 2014-06-07 DIAGNOSIS — I6523 Occlusion and stenosis of bilateral carotid arteries: Secondary | ICD-10-CM | POA: Diagnosis not present

## 2014-06-07 DIAGNOSIS — M545 Low back pain: Secondary | ICD-10-CM | POA: Diagnosis not present

## 2014-06-07 DIAGNOSIS — I6521 Occlusion and stenosis of right carotid artery: Secondary | ICD-10-CM | POA: Diagnosis not present

## 2014-06-07 DIAGNOSIS — M4806 Spinal stenosis, lumbar region: Secondary | ICD-10-CM | POA: Diagnosis not present

## 2014-06-07 DIAGNOSIS — G894 Chronic pain syndrome: Secondary | ICD-10-CM | POA: Diagnosis not present

## 2014-06-07 DIAGNOSIS — Z95820 Peripheral vascular angioplasty status with implants and grafts: Secondary | ICD-10-CM | POA: Diagnosis not present

## 2014-06-17 DIAGNOSIS — M4806 Spinal stenosis, lumbar region: Secondary | ICD-10-CM | POA: Diagnosis not present

## 2014-08-02 DIAGNOSIS — M545 Low back pain: Secondary | ICD-10-CM | POA: Diagnosis not present

## 2014-08-07 DIAGNOSIS — I2510 Atherosclerotic heart disease of native coronary artery without angina pectoris: Secondary | ICD-10-CM | POA: Diagnosis not present

## 2014-08-07 DIAGNOSIS — M545 Low back pain: Secondary | ICD-10-CM | POA: Diagnosis not present

## 2014-08-07 DIAGNOSIS — M4806 Spinal stenosis, lumbar region: Secondary | ICD-10-CM | POA: Diagnosis not present

## 2014-08-12 DIAGNOSIS — G894 Chronic pain syndrome: Secondary | ICD-10-CM | POA: Diagnosis not present

## 2014-08-12 DIAGNOSIS — M545 Low back pain: Secondary | ICD-10-CM | POA: Diagnosis not present

## 2014-08-12 DIAGNOSIS — Z79899 Other long term (current) drug therapy: Secondary | ICD-10-CM | POA: Diagnosis not present

## 2014-08-12 DIAGNOSIS — M5126 Other intervertebral disc displacement, lumbar region: Secondary | ICD-10-CM | POA: Diagnosis not present

## 2014-08-12 DIAGNOSIS — R202 Paresthesia of skin: Secondary | ICD-10-CM | POA: Diagnosis not present

## 2014-08-12 DIAGNOSIS — M4806 Spinal stenosis, lumbar region: Secondary | ICD-10-CM | POA: Diagnosis not present

## 2014-08-21 DIAGNOSIS — Z01810 Encounter for preprocedural cardiovascular examination: Secondary | ICD-10-CM | POA: Diagnosis not present

## 2014-08-21 DIAGNOSIS — I2510 Atherosclerotic heart disease of native coronary artery without angina pectoris: Secondary | ICD-10-CM | POA: Diagnosis not present

## 2014-08-22 DIAGNOSIS — Z01810 Encounter for preprocedural cardiovascular examination: Secondary | ICD-10-CM | POA: Diagnosis not present

## 2014-08-22 DIAGNOSIS — I2510 Atherosclerotic heart disease of native coronary artery without angina pectoris: Secondary | ICD-10-CM | POA: Diagnosis not present

## 2014-09-04 DIAGNOSIS — M545 Low back pain: Secondary | ICD-10-CM | POA: Diagnosis not present

## 2014-09-04 DIAGNOSIS — Z716 Tobacco abuse counseling: Secondary | ICD-10-CM | POA: Diagnosis not present

## 2014-09-04 DIAGNOSIS — Z6835 Body mass index (BMI) 35.0-35.9, adult: Secondary | ICD-10-CM | POA: Diagnosis not present

## 2014-09-04 DIAGNOSIS — E785 Hyperlipidemia, unspecified: Secondary | ICD-10-CM | POA: Diagnosis not present

## 2014-09-04 DIAGNOSIS — F419 Anxiety disorder, unspecified: Secondary | ICD-10-CM | POA: Diagnosis not present

## 2014-09-04 DIAGNOSIS — I2510 Atherosclerotic heart disease of native coronary artery without angina pectoris: Secondary | ICD-10-CM | POA: Diagnosis not present

## 2014-09-04 DIAGNOSIS — I10 Essential (primary) hypertension: Secondary | ICD-10-CM | POA: Diagnosis not present

## 2014-09-04 DIAGNOSIS — E119 Type 2 diabetes mellitus without complications: Secondary | ICD-10-CM | POA: Diagnosis not present

## 2014-09-04 DIAGNOSIS — R609 Edema, unspecified: Secondary | ICD-10-CM | POA: Diagnosis not present

## 2014-09-17 DIAGNOSIS — M545 Low back pain: Secondary | ICD-10-CM | POA: Diagnosis not present

## 2014-09-17 DIAGNOSIS — M4806 Spinal stenosis, lumbar region: Secondary | ICD-10-CM | POA: Diagnosis not present

## 2014-09-17 DIAGNOSIS — I2510 Atherosclerotic heart disease of native coronary artery without angina pectoris: Secondary | ICD-10-CM | POA: Diagnosis not present

## 2014-10-09 DIAGNOSIS — Z01818 Encounter for other preprocedural examination: Secondary | ICD-10-CM | POA: Diagnosis not present

## 2014-10-09 DIAGNOSIS — Z7982 Long term (current) use of aspirin: Secondary | ICD-10-CM | POA: Diagnosis not present

## 2014-10-09 DIAGNOSIS — M5127 Other intervertebral disc displacement, lumbosacral region: Secondary | ICD-10-CM | POA: Diagnosis not present

## 2014-10-09 DIAGNOSIS — F17200 Nicotine dependence, unspecified, uncomplicated: Secondary | ICD-10-CM | POA: Diagnosis not present

## 2014-10-09 DIAGNOSIS — M1990 Unspecified osteoarthritis, unspecified site: Secondary | ICD-10-CM | POA: Diagnosis not present

## 2014-10-09 DIAGNOSIS — M4806 Spinal stenosis, lumbar region: Secondary | ICD-10-CM | POA: Diagnosis not present

## 2014-10-09 DIAGNOSIS — Z79899 Other long term (current) drug therapy: Secondary | ICD-10-CM | POA: Diagnosis not present

## 2014-10-09 DIAGNOSIS — Z8673 Personal history of transient ischemic attack (TIA), and cerebral infarction without residual deficits: Secondary | ICD-10-CM | POA: Diagnosis not present

## 2014-10-09 DIAGNOSIS — R7309 Other abnormal glucose: Secondary | ICD-10-CM | POA: Diagnosis not present

## 2014-10-09 DIAGNOSIS — Z955 Presence of coronary angioplasty implant and graft: Secondary | ICD-10-CM | POA: Diagnosis not present

## 2014-10-09 DIAGNOSIS — I2510 Atherosclerotic heart disease of native coronary artery without angina pectoris: Secondary | ICD-10-CM | POA: Diagnosis not present

## 2014-10-09 DIAGNOSIS — Z7902 Long term (current) use of antithrombotics/antiplatelets: Secondary | ICD-10-CM | POA: Diagnosis not present

## 2014-10-15 DIAGNOSIS — Z9889 Other specified postprocedural states: Secondary | ICD-10-CM | POA: Diagnosis not present

## 2014-10-15 DIAGNOSIS — M5127 Other intervertebral disc displacement, lumbosacral region: Secondary | ICD-10-CM | POA: Diagnosis not present

## 2014-10-15 DIAGNOSIS — M4806 Spinal stenosis, lumbar region: Secondary | ICD-10-CM | POA: Diagnosis not present

## 2014-10-15 DIAGNOSIS — Z79899 Other long term (current) drug therapy: Secondary | ICD-10-CM | POA: Diagnosis not present

## 2014-10-15 DIAGNOSIS — M1990 Unspecified osteoarthritis, unspecified site: Secondary | ICD-10-CM | POA: Diagnosis not present

## 2014-10-15 DIAGNOSIS — R7309 Other abnormal glucose: Secondary | ICD-10-CM | POA: Diagnosis not present

## 2014-10-15 DIAGNOSIS — I2510 Atherosclerotic heart disease of native coronary artery without angina pectoris: Secondary | ICD-10-CM | POA: Diagnosis not present

## 2014-10-16 DIAGNOSIS — M1990 Unspecified osteoarthritis, unspecified site: Secondary | ICD-10-CM | POA: Diagnosis not present

## 2014-10-16 DIAGNOSIS — M4806 Spinal stenosis, lumbar region: Secondary | ICD-10-CM | POA: Diagnosis not present

## 2014-10-16 DIAGNOSIS — Z79899 Other long term (current) drug therapy: Secondary | ICD-10-CM | POA: Diagnosis not present

## 2014-10-16 DIAGNOSIS — R7309 Other abnormal glucose: Secondary | ICD-10-CM | POA: Diagnosis not present

## 2014-10-16 DIAGNOSIS — M5127 Other intervertebral disc displacement, lumbosacral region: Secondary | ICD-10-CM | POA: Diagnosis not present

## 2014-10-16 DIAGNOSIS — I2510 Atherosclerotic heart disease of native coronary artery without angina pectoris: Secondary | ICD-10-CM | POA: Diagnosis not present

## 2014-11-13 DIAGNOSIS — E119 Type 2 diabetes mellitus without complications: Secondary | ICD-10-CM | POA: Diagnosis not present

## 2014-11-13 DIAGNOSIS — I2510 Atherosclerotic heart disease of native coronary artery without angina pectoris: Secondary | ICD-10-CM | POA: Diagnosis not present

## 2014-11-13 DIAGNOSIS — R609 Edema, unspecified: Secondary | ICD-10-CM | POA: Diagnosis not present

## 2014-11-13 DIAGNOSIS — Z716 Tobacco abuse counseling: Secondary | ICD-10-CM | POA: Diagnosis not present

## 2014-11-13 DIAGNOSIS — I10 Essential (primary) hypertension: Secondary | ICD-10-CM | POA: Diagnosis not present

## 2014-11-13 DIAGNOSIS — F419 Anxiety disorder, unspecified: Secondary | ICD-10-CM | POA: Diagnosis not present

## 2014-11-13 DIAGNOSIS — E785 Hyperlipidemia, unspecified: Secondary | ICD-10-CM | POA: Diagnosis not present

## 2014-11-13 DIAGNOSIS — Z6832 Body mass index (BMI) 32.0-32.9, adult: Secondary | ICD-10-CM | POA: Diagnosis not present

## 2014-11-15 DIAGNOSIS — M545 Low back pain: Secondary | ICD-10-CM | POA: Diagnosis not present

## 2014-11-15 DIAGNOSIS — M5126 Other intervertebral disc displacement, lumbar region: Secondary | ICD-10-CM | POA: Diagnosis not present

## 2014-11-15 DIAGNOSIS — Z79899 Other long term (current) drug therapy: Secondary | ICD-10-CM | POA: Diagnosis not present

## 2014-11-15 DIAGNOSIS — G894 Chronic pain syndrome: Secondary | ICD-10-CM | POA: Diagnosis not present

## 2014-11-15 DIAGNOSIS — M4806 Spinal stenosis, lumbar region: Secondary | ICD-10-CM | POA: Diagnosis not present

## 2015-01-14 DIAGNOSIS — N529 Male erectile dysfunction, unspecified: Secondary | ICD-10-CM | POA: Diagnosis not present

## 2015-01-14 DIAGNOSIS — E785 Hyperlipidemia, unspecified: Secondary | ICD-10-CM | POA: Diagnosis not present

## 2015-01-14 DIAGNOSIS — R791 Abnormal coagulation profile: Secondary | ICD-10-CM | POA: Diagnosis not present

## 2015-01-14 DIAGNOSIS — R609 Edema, unspecified: Secondary | ICD-10-CM | POA: Diagnosis not present

## 2015-01-14 DIAGNOSIS — I2510 Atherosclerotic heart disease of native coronary artery without angina pectoris: Secondary | ICD-10-CM | POA: Diagnosis not present

## 2015-01-14 DIAGNOSIS — M545 Low back pain: Secondary | ICD-10-CM | POA: Diagnosis not present

## 2015-01-14 DIAGNOSIS — F419 Anxiety disorder, unspecified: Secondary | ICD-10-CM | POA: Diagnosis not present

## 2015-01-14 DIAGNOSIS — E668 Other obesity: Secondary | ICD-10-CM | POA: Diagnosis not present

## 2015-01-28 DIAGNOSIS — Z8673 Personal history of transient ischemic attack (TIA), and cerebral infarction without residual deficits: Secondary | ICD-10-CM | POA: Diagnosis not present

## 2015-01-28 DIAGNOSIS — M546 Pain in thoracic spine: Secondary | ICD-10-CM | POA: Diagnosis not present

## 2015-01-28 DIAGNOSIS — S0990XA Unspecified injury of head, initial encounter: Secondary | ICD-10-CM | POA: Diagnosis not present

## 2015-01-28 DIAGNOSIS — M545 Low back pain: Secondary | ICD-10-CM | POA: Diagnosis not present

## 2015-01-28 DIAGNOSIS — Z7982 Long term (current) use of aspirin: Secondary | ICD-10-CM | POA: Diagnosis not present

## 2015-01-28 DIAGNOSIS — R6889 Other general symptoms and signs: Secondary | ICD-10-CM | POA: Diagnosis not present

## 2015-01-28 DIAGNOSIS — S3992XA Unspecified injury of lower back, initial encounter: Secondary | ICD-10-CM | POA: Diagnosis not present

## 2015-01-28 DIAGNOSIS — R51 Headache: Secondary | ICD-10-CM | POA: Diagnosis not present

## 2015-01-28 DIAGNOSIS — M542 Cervicalgia: Secondary | ICD-10-CM | POA: Diagnosis not present

## 2015-01-28 DIAGNOSIS — S299XXA Unspecified injury of thorax, initial encounter: Secondary | ICD-10-CM | POA: Diagnosis not present

## 2015-01-28 DIAGNOSIS — S199XXA Unspecified injury of neck, initial encounter: Secondary | ICD-10-CM | POA: Diagnosis not present

## 2015-01-28 DIAGNOSIS — R0789 Other chest pain: Secondary | ICD-10-CM | POA: Diagnosis not present

## 2015-01-28 DIAGNOSIS — R079 Chest pain, unspecified: Secondary | ICD-10-CM | POA: Diagnosis not present

## 2015-02-10 DIAGNOSIS — N529 Male erectile dysfunction, unspecified: Secondary | ICD-10-CM | POA: Diagnosis not present

## 2015-02-10 DIAGNOSIS — L02413 Cutaneous abscess of right upper limb: Secondary | ICD-10-CM | POA: Diagnosis not present

## 2015-02-12 DIAGNOSIS — L02212 Cutaneous abscess of back [any part, except buttock]: Secondary | ICD-10-CM | POA: Diagnosis not present

## 2015-02-17 DIAGNOSIS — M545 Low back pain: Secondary | ICD-10-CM | POA: Diagnosis not present

## 2015-02-17 DIAGNOSIS — G894 Chronic pain syndrome: Secondary | ICD-10-CM | POA: Diagnosis not present

## 2015-02-17 DIAGNOSIS — Z79899 Other long term (current) drug therapy: Secondary | ICD-10-CM | POA: Diagnosis not present

## 2015-02-17 DIAGNOSIS — M5126 Other intervertebral disc displacement, lumbar region: Secondary | ICD-10-CM | POA: Diagnosis not present

## 2015-02-17 DIAGNOSIS — M4806 Spinal stenosis, lumbar region: Secondary | ICD-10-CM | POA: Diagnosis not present

## 2015-04-02 DIAGNOSIS — Z95820 Peripheral vascular angioplasty status with implants and grafts: Secondary | ICD-10-CM | POA: Diagnosis not present

## 2015-04-02 DIAGNOSIS — I6523 Occlusion and stenosis of bilateral carotid arteries: Secondary | ICD-10-CM | POA: Diagnosis not present

## 2015-04-20 DIAGNOSIS — G8929 Other chronic pain: Secondary | ICD-10-CM | POA: Diagnosis not present

## 2015-04-20 DIAGNOSIS — I252 Old myocardial infarction: Secondary | ICD-10-CM | POA: Diagnosis not present

## 2015-04-20 DIAGNOSIS — Z79899 Other long term (current) drug therapy: Secondary | ICD-10-CM | POA: Diagnosis not present

## 2015-04-20 DIAGNOSIS — F4320 Adjustment disorder, unspecified: Secondary | ICD-10-CM | POA: Diagnosis not present

## 2015-04-20 DIAGNOSIS — T50901A Poisoning by unspecified drugs, medicaments and biological substances, accidental (unintentional), initial encounter: Secondary | ICD-10-CM | POA: Diagnosis not present

## 2015-04-20 DIAGNOSIS — Z781 Physical restraint status: Secondary | ICD-10-CM | POA: Diagnosis not present

## 2015-04-20 DIAGNOSIS — T1491 Suicide attempt: Secondary | ICD-10-CM | POA: Diagnosis not present

## 2015-04-20 DIAGNOSIS — R4182 Altered mental status, unspecified: Secondary | ICD-10-CM | POA: Diagnosis not present

## 2015-04-20 DIAGNOSIS — T43501A Poisoning by unspecified antipsychotics and neuroleptics, accidental (unintentional), initial encounter: Secondary | ICD-10-CM | POA: Diagnosis not present

## 2015-04-20 DIAGNOSIS — Z7982 Long term (current) use of aspirin: Secondary | ICD-10-CM | POA: Diagnosis not present

## 2015-04-20 DIAGNOSIS — F17210 Nicotine dependence, cigarettes, uncomplicated: Secondary | ICD-10-CM | POA: Diagnosis present

## 2015-04-20 DIAGNOSIS — T428X2A Poisoning by antiparkinsonism drugs and other central muscle-tone depressants, intentional self-harm, initial encounter: Secondary | ICD-10-CM | POA: Diagnosis not present

## 2015-04-20 DIAGNOSIS — I10 Essential (primary) hypertension: Secondary | ICD-10-CM | POA: Diagnosis not present

## 2015-04-20 DIAGNOSIS — G894 Chronic pain syndrome: Secondary | ICD-10-CM | POA: Diagnosis not present

## 2015-04-20 DIAGNOSIS — T50904A Poisoning by unspecified drugs, medicaments and biological substances, undetermined, initial encounter: Secondary | ICD-10-CM | POA: Diagnosis not present

## 2015-04-20 DIAGNOSIS — Z4682 Encounter for fitting and adjustment of non-vascular catheter: Secondary | ICD-10-CM | POA: Diagnosis not present

## 2015-04-20 DIAGNOSIS — Z8673 Personal history of transient ischemic attack (TIA), and cerebral infarction without residual deficits: Secondary | ICD-10-CM | POA: Diagnosis not present

## 2015-04-20 DIAGNOSIS — T426X2A Poisoning by other antiepileptic and sedative-hypnotic drugs, intentional self-harm, initial encounter: Secondary | ICD-10-CM | POA: Diagnosis not present

## 2015-04-20 DIAGNOSIS — T50902A Poisoning by unspecified drugs, medicaments and biological substances, intentional self-harm, initial encounter: Secondary | ICD-10-CM | POA: Diagnosis not present

## 2015-04-23 DIAGNOSIS — I2510 Atherosclerotic heart disease of native coronary artery without angina pectoris: Secondary | ICD-10-CM | POA: Diagnosis not present

## 2015-04-23 DIAGNOSIS — E785 Hyperlipidemia, unspecified: Secondary | ICD-10-CM | POA: Diagnosis not present

## 2015-04-23 DIAGNOSIS — I10 Essential (primary) hypertension: Secondary | ICD-10-CM | POA: Diagnosis not present

## 2015-04-23 DIAGNOSIS — F332 Major depressive disorder, recurrent severe without psychotic features: Secondary | ICD-10-CM | POA: Diagnosis not present

## 2015-04-24 DIAGNOSIS — F332 Major depressive disorder, recurrent severe without psychotic features: Secondary | ICD-10-CM | POA: Diagnosis not present

## 2015-04-25 DIAGNOSIS — F332 Major depressive disorder, recurrent severe without psychotic features: Secondary | ICD-10-CM | POA: Diagnosis not present

## 2015-04-26 DIAGNOSIS — F332 Major depressive disorder, recurrent severe without psychotic features: Secondary | ICD-10-CM | POA: Diagnosis not present

## 2015-04-28 DIAGNOSIS — F332 Major depressive disorder, recurrent severe without psychotic features: Secondary | ICD-10-CM | POA: Diagnosis not present

## 2015-04-29 DIAGNOSIS — F332 Major depressive disorder, recurrent severe without psychotic features: Secondary | ICD-10-CM | POA: Diagnosis not present

## 2015-04-30 DIAGNOSIS — F332 Major depressive disorder, recurrent severe without psychotic features: Secondary | ICD-10-CM | POA: Diagnosis not present

## 2015-05-01 DIAGNOSIS — F332 Major depressive disorder, recurrent severe without psychotic features: Secondary | ICD-10-CM | POA: Diagnosis not present

## 2015-05-02 DIAGNOSIS — F332 Major depressive disorder, recurrent severe without psychotic features: Secondary | ICD-10-CM | POA: Diagnosis not present

## 2015-05-09 DIAGNOSIS — I2510 Atherosclerotic heart disease of native coronary artery without angina pectoris: Secondary | ICD-10-CM | POA: Diagnosis not present

## 2015-05-09 DIAGNOSIS — E119 Type 2 diabetes mellitus without complications: Secondary | ICD-10-CM | POA: Diagnosis not present

## 2015-05-09 DIAGNOSIS — E785 Hyperlipidemia, unspecified: Secondary | ICD-10-CM | POA: Diagnosis not present

## 2015-05-09 DIAGNOSIS — I10 Essential (primary) hypertension: Secondary | ICD-10-CM | POA: Diagnosis not present

## 2015-05-09 DIAGNOSIS — F329 Major depressive disorder, single episode, unspecified: Secondary | ICD-10-CM | POA: Diagnosis not present

## 2015-05-19 DIAGNOSIS — F320 Major depressive disorder, single episode, mild: Secondary | ICD-10-CM | POA: Diagnosis not present

## 2015-05-20 DIAGNOSIS — Z79899 Other long term (current) drug therapy: Secondary | ICD-10-CM | POA: Diagnosis not present

## 2015-07-03 DIAGNOSIS — Z6830 Body mass index (BMI) 30.0-30.9, adult: Secondary | ICD-10-CM | POA: Diagnosis not present

## 2015-07-03 DIAGNOSIS — F329 Major depressive disorder, single episode, unspecified: Secondary | ICD-10-CM | POA: Diagnosis not present

## 2015-07-03 DIAGNOSIS — G4700 Insomnia, unspecified: Secondary | ICD-10-CM | POA: Diagnosis not present

## 2015-07-11 DIAGNOSIS — M5126 Other intervertebral disc displacement, lumbar region: Secondary | ICD-10-CM | POA: Diagnosis not present

## 2015-07-11 DIAGNOSIS — Z79899 Other long term (current) drug therapy: Secondary | ICD-10-CM | POA: Diagnosis not present

## 2015-07-11 DIAGNOSIS — M545 Low back pain: Secondary | ICD-10-CM | POA: Diagnosis not present

## 2015-07-11 DIAGNOSIS — M4806 Spinal stenosis, lumbar region: Secondary | ICD-10-CM | POA: Diagnosis not present

## 2015-07-11 DIAGNOSIS — G894 Chronic pain syndrome: Secondary | ICD-10-CM | POA: Diagnosis not present

## 2015-08-28 DIAGNOSIS — E785 Hyperlipidemia, unspecified: Secondary | ICD-10-CM | POA: Diagnosis not present

## 2015-08-28 DIAGNOSIS — F329 Major depressive disorder, single episode, unspecified: Secondary | ICD-10-CM | POA: Diagnosis not present

## 2015-08-28 DIAGNOSIS — Z6831 Body mass index (BMI) 31.0-31.9, adult: Secondary | ICD-10-CM | POA: Diagnosis not present

## 2015-08-28 DIAGNOSIS — E669 Obesity, unspecified: Secondary | ICD-10-CM | POA: Diagnosis not present

## 2015-08-28 DIAGNOSIS — N529 Male erectile dysfunction, unspecified: Secondary | ICD-10-CM | POA: Diagnosis not present

## 2015-08-28 DIAGNOSIS — I2510 Atherosclerotic heart disease of native coronary artery without angina pectoris: Secondary | ICD-10-CM | POA: Diagnosis not present

## 2015-08-28 DIAGNOSIS — R29898 Other symptoms and signs involving the musculoskeletal system: Secondary | ICD-10-CM | POA: Diagnosis not present

## 2015-08-28 DIAGNOSIS — E119 Type 2 diabetes mellitus without complications: Secondary | ICD-10-CM | POA: Diagnosis not present

## 2015-09-04 DIAGNOSIS — I10 Essential (primary) hypertension: Secondary | ICD-10-CM

## 2015-09-04 DIAGNOSIS — F17210 Nicotine dependence, cigarettes, uncomplicated: Secondary | ICD-10-CM

## 2015-09-04 HISTORY — DX: Nicotine dependence, cigarettes, uncomplicated: F17.210

## 2015-09-04 HISTORY — DX: Essential (primary) hypertension: I10

## 2015-09-05 DIAGNOSIS — L03113 Cellulitis of right upper limb: Secondary | ICD-10-CM | POA: Diagnosis not present

## 2015-09-05 DIAGNOSIS — Z6832 Body mass index (BMI) 32.0-32.9, adult: Secondary | ICD-10-CM | POA: Diagnosis not present

## 2015-09-06 DIAGNOSIS — I96 Gangrene, not elsewhere classified: Secondary | ICD-10-CM | POA: Diagnosis not present

## 2015-09-06 DIAGNOSIS — G894 Chronic pain syndrome: Secondary | ICD-10-CM | POA: Diagnosis present

## 2015-09-06 DIAGNOSIS — Z79899 Other long term (current) drug therapy: Secondary | ICD-10-CM | POA: Diagnosis not present

## 2015-09-06 DIAGNOSIS — L02413 Cutaneous abscess of right upper limb: Secondary | ICD-10-CM | POA: Diagnosis present

## 2015-09-06 DIAGNOSIS — F418 Other specified anxiety disorders: Secondary | ICD-10-CM | POA: Diagnosis present

## 2015-09-06 DIAGNOSIS — Z885 Allergy status to narcotic agent status: Secondary | ICD-10-CM | POA: Diagnosis not present

## 2015-09-06 DIAGNOSIS — M7989 Other specified soft tissue disorders: Secondary | ICD-10-CM | POA: Diagnosis not present

## 2015-09-06 DIAGNOSIS — Z7982 Long term (current) use of aspirin: Secondary | ICD-10-CM | POA: Diagnosis not present

## 2015-09-06 DIAGNOSIS — E7800 Pure hypercholesterolemia, unspecified: Secondary | ICD-10-CM | POA: Diagnosis present

## 2015-09-06 DIAGNOSIS — F329 Major depressive disorder, single episode, unspecified: Secondary | ICD-10-CM | POA: Diagnosis present

## 2015-09-06 DIAGNOSIS — M79631 Pain in right forearm: Secondary | ICD-10-CM | POA: Diagnosis not present

## 2015-09-06 DIAGNOSIS — Z8673 Personal history of transient ischemic attack (TIA), and cerebral infarction without residual deficits: Secondary | ICD-10-CM | POA: Diagnosis not present

## 2015-09-06 DIAGNOSIS — A4902 Methicillin resistant Staphylococcus aureus infection, unspecified site: Secondary | ICD-10-CM | POA: Diagnosis not present

## 2015-09-06 DIAGNOSIS — I2510 Atherosclerotic heart disease of native coronary artery without angina pectoris: Secondary | ICD-10-CM | POA: Diagnosis present

## 2015-09-06 DIAGNOSIS — B9562 Methicillin resistant Staphylococcus aureus infection as the cause of diseases classified elsewhere: Secondary | ICD-10-CM | POA: Diagnosis present

## 2015-09-06 DIAGNOSIS — I10 Essential (primary) hypertension: Secondary | ICD-10-CM | POA: Diagnosis present

## 2015-09-06 DIAGNOSIS — I252 Old myocardial infarction: Secondary | ICD-10-CM | POA: Diagnosis not present

## 2015-09-06 DIAGNOSIS — F17210 Nicotine dependence, cigarettes, uncomplicated: Secondary | ICD-10-CM | POA: Diagnosis present

## 2015-09-06 DIAGNOSIS — L03113 Cellulitis of right upper limb: Secondary | ICD-10-CM | POA: Diagnosis not present

## 2015-09-10 DIAGNOSIS — L02413 Cutaneous abscess of right upper limb: Secondary | ICD-10-CM | POA: Diagnosis not present

## 2015-09-10 DIAGNOSIS — T8189XA Other complications of procedures, not elsewhere classified, initial encounter: Secondary | ICD-10-CM | POA: Diagnosis not present

## 2015-09-15 DIAGNOSIS — L02413 Cutaneous abscess of right upper limb: Secondary | ICD-10-CM | POA: Diagnosis not present

## 2015-09-17 DIAGNOSIS — L02413 Cutaneous abscess of right upper limb: Secondary | ICD-10-CM | POA: Diagnosis not present

## 2015-09-17 DIAGNOSIS — I2510 Atherosclerotic heart disease of native coronary artery without angina pectoris: Secondary | ICD-10-CM | POA: Diagnosis not present

## 2015-09-17 DIAGNOSIS — I252 Old myocardial infarction: Secondary | ICD-10-CM | POA: Diagnosis not present

## 2015-09-17 DIAGNOSIS — T8189XA Other complications of procedures, not elsewhere classified, initial encounter: Secondary | ICD-10-CM | POA: Diagnosis not present

## 2015-09-17 DIAGNOSIS — Z8673 Personal history of transient ischemic attack (TIA), and cerebral infarction without residual deficits: Secondary | ICD-10-CM | POA: Diagnosis not present

## 2015-09-17 DIAGNOSIS — F17210 Nicotine dependence, cigarettes, uncomplicated: Secondary | ICD-10-CM | POA: Diagnosis not present

## 2015-09-17 DIAGNOSIS — A4902 Methicillin resistant Staphylococcus aureus infection, unspecified site: Secondary | ICD-10-CM | POA: Diagnosis not present

## 2015-09-17 DIAGNOSIS — I10 Essential (primary) hypertension: Secondary | ICD-10-CM | POA: Diagnosis not present

## 2015-11-20 DIAGNOSIS — E785 Hyperlipidemia, unspecified: Secondary | ICD-10-CM

## 2015-11-20 DIAGNOSIS — M545 Low back pain, unspecified: Secondary | ICD-10-CM

## 2015-11-20 DIAGNOSIS — M5127 Other intervertebral disc displacement, lumbosacral region: Secondary | ICD-10-CM

## 2015-11-20 DIAGNOSIS — I252 Old myocardial infarction: Secondary | ICD-10-CM

## 2015-11-20 DIAGNOSIS — I2510 Atherosclerotic heart disease of native coronary artery without angina pectoris: Secondary | ICD-10-CM

## 2015-11-20 DIAGNOSIS — Z79899 Other long term (current) drug therapy: Secondary | ICD-10-CM | POA: Insufficient documentation

## 2015-11-20 DIAGNOSIS — M48061 Spinal stenosis, lumbar region without neurogenic claudication: Secondary | ICD-10-CM

## 2015-11-20 DIAGNOSIS — I739 Peripheral vascular disease, unspecified: Secondary | ICD-10-CM

## 2015-11-20 DIAGNOSIS — I6529 Occlusion and stenosis of unspecified carotid artery: Secondary | ICD-10-CM

## 2015-11-20 DIAGNOSIS — R202 Paresthesia of skin: Secondary | ICD-10-CM

## 2015-11-20 DIAGNOSIS — R200 Anesthesia of skin: Secondary | ICD-10-CM | POA: Insufficient documentation

## 2015-11-20 DIAGNOSIS — G894 Chronic pain syndrome: Secondary | ICD-10-CM | POA: Insufficient documentation

## 2015-11-20 DIAGNOSIS — G459 Transient cerebral ischemic attack, unspecified: Secondary | ICD-10-CM

## 2015-11-20 DIAGNOSIS — E119 Type 2 diabetes mellitus without complications: Secondary | ICD-10-CM | POA: Insufficient documentation

## 2015-11-20 HISTORY — DX: Spinal stenosis, lumbar region without neurogenic claudication: M48.061

## 2015-11-20 HISTORY — DX: Atherosclerotic heart disease of native coronary artery without angina pectoris: I25.10

## 2015-11-20 HISTORY — DX: Transient cerebral ischemic attack, unspecified: G45.9

## 2015-11-20 HISTORY — DX: Other long term (current) drug therapy: Z79.899

## 2015-11-20 HISTORY — DX: Low back pain, unspecified: M54.50

## 2015-11-20 HISTORY — DX: Other intervertebral disc displacement, lumbosacral region: M51.27

## 2015-11-20 HISTORY — DX: Hyperlipidemia, unspecified: E78.5

## 2015-11-20 HISTORY — DX: Occlusion and stenosis of unspecified carotid artery: I65.29

## 2015-11-20 HISTORY — DX: Chronic pain syndrome: G89.4

## 2015-11-20 HISTORY — DX: Peripheral vascular disease, unspecified: I73.9

## 2015-11-20 HISTORY — DX: Type 2 diabetes mellitus without complications: E11.9

## 2015-11-20 HISTORY — DX: Paresthesia of skin: R20.2

## 2015-11-20 HISTORY — DX: Anesthesia of skin: R20.0

## 2015-11-20 HISTORY — DX: Old myocardial infarction: I25.2

## 2015-11-27 DIAGNOSIS — R202 Paresthesia of skin: Secondary | ICD-10-CM | POA: Diagnosis not present

## 2015-11-27 DIAGNOSIS — M5441 Lumbago with sciatica, right side: Secondary | ICD-10-CM | POA: Diagnosis not present

## 2015-11-27 DIAGNOSIS — R200 Anesthesia of skin: Secondary | ICD-10-CM | POA: Diagnosis not present

## 2015-11-27 DIAGNOSIS — M4806 Spinal stenosis, lumbar region: Secondary | ICD-10-CM | POA: Diagnosis not present

## 2015-11-27 DIAGNOSIS — G894 Chronic pain syndrome: Secondary | ICD-10-CM | POA: Diagnosis not present

## 2015-11-27 DIAGNOSIS — M5127 Other intervertebral disc displacement, lumbosacral region: Secondary | ICD-10-CM | POA: Diagnosis not present

## 2015-11-27 DIAGNOSIS — Z79899 Other long term (current) drug therapy: Secondary | ICD-10-CM | POA: Diagnosis not present

## 2015-11-27 DIAGNOSIS — M5442 Lumbago with sciatica, left side: Secondary | ICD-10-CM | POA: Diagnosis not present

## 2015-11-27 DIAGNOSIS — G8929 Other chronic pain: Secondary | ICD-10-CM | POA: Diagnosis not present

## 2015-11-28 DIAGNOSIS — Z79899 Other long term (current) drug therapy: Secondary | ICD-10-CM | POA: Diagnosis not present

## 2015-11-28 DIAGNOSIS — J449 Chronic obstructive pulmonary disease, unspecified: Secondary | ICD-10-CM | POA: Diagnosis not present

## 2015-11-28 DIAGNOSIS — R0602 Shortness of breath: Secondary | ICD-10-CM | POA: Diagnosis not present

## 2015-11-28 DIAGNOSIS — E119 Type 2 diabetes mellitus without complications: Secondary | ICD-10-CM | POA: Diagnosis not present

## 2015-11-28 DIAGNOSIS — J189 Pneumonia, unspecified organism: Secondary | ICD-10-CM | POA: Diagnosis not present

## 2015-11-28 DIAGNOSIS — M818 Other osteoporosis without current pathological fracture: Secondary | ICD-10-CM | POA: Diagnosis not present

## 2015-11-28 DIAGNOSIS — Z716 Tobacco abuse counseling: Secondary | ICD-10-CM | POA: Diagnosis not present

## 2015-11-28 DIAGNOSIS — I509 Heart failure, unspecified: Secondary | ICD-10-CM | POA: Diagnosis not present

## 2015-11-28 DIAGNOSIS — Z6834 Body mass index (BMI) 34.0-34.9, adult: Secondary | ICD-10-CM | POA: Diagnosis not present

## 2015-11-28 DIAGNOSIS — I2510 Atherosclerotic heart disease of native coronary artery without angina pectoris: Secondary | ICD-10-CM | POA: Diagnosis not present

## 2015-11-28 DIAGNOSIS — E785 Hyperlipidemia, unspecified: Secondary | ICD-10-CM | POA: Diagnosis not present

## 2015-12-08 DIAGNOSIS — J189 Pneumonia, unspecified organism: Secondary | ICD-10-CM | POA: Diagnosis not present

## 2016-01-27 DIAGNOSIS — E669 Obesity, unspecified: Secondary | ICD-10-CM | POA: Diagnosis not present

## 2016-01-27 DIAGNOSIS — L0211 Cutaneous abscess of neck: Secondary | ICD-10-CM | POA: Diagnosis not present

## 2016-01-27 DIAGNOSIS — J449 Chronic obstructive pulmonary disease, unspecified: Secondary | ICD-10-CM | POA: Diagnosis not present

## 2016-01-27 DIAGNOSIS — R042 Hemoptysis: Secondary | ICD-10-CM | POA: Diagnosis not present

## 2016-01-27 DIAGNOSIS — F329 Major depressive disorder, single episode, unspecified: Secondary | ICD-10-CM | POA: Diagnosis not present

## 2016-01-27 DIAGNOSIS — Z6836 Body mass index (BMI) 36.0-36.9, adult: Secondary | ICD-10-CM | POA: Diagnosis not present

## 2016-01-27 DIAGNOSIS — M545 Low back pain: Secondary | ICD-10-CM | POA: Diagnosis not present

## 2016-02-03 DIAGNOSIS — R042 Hemoptysis: Secondary | ICD-10-CM | POA: Diagnosis not present

## 2016-02-03 DIAGNOSIS — I517 Cardiomegaly: Secondary | ICD-10-CM | POA: Diagnosis not present

## 2016-02-04 DIAGNOSIS — M5127 Other intervertebral disc displacement, lumbosacral region: Secondary | ICD-10-CM | POA: Diagnosis not present

## 2016-02-04 DIAGNOSIS — M5441 Lumbago with sciatica, right side: Secondary | ICD-10-CM | POA: Diagnosis not present

## 2016-02-04 DIAGNOSIS — M4806 Spinal stenosis, lumbar region: Secondary | ICD-10-CM | POA: Diagnosis not present

## 2016-02-04 DIAGNOSIS — G894 Chronic pain syndrome: Secondary | ICD-10-CM | POA: Diagnosis not present

## 2016-02-04 DIAGNOSIS — G8929 Other chronic pain: Secondary | ICD-10-CM | POA: Diagnosis not present

## 2016-02-04 DIAGNOSIS — Z79899 Other long term (current) drug therapy: Secondary | ICD-10-CM | POA: Diagnosis not present

## 2016-02-04 DIAGNOSIS — M5442 Lumbago with sciatica, left side: Secondary | ICD-10-CM | POA: Diagnosis not present

## 2016-02-12 DIAGNOSIS — E119 Type 2 diabetes mellitus without complications: Secondary | ICD-10-CM | POA: Diagnosis not present

## 2016-02-12 DIAGNOSIS — R0602 Shortness of breath: Secondary | ICD-10-CM | POA: Diagnosis not present

## 2016-02-12 DIAGNOSIS — I509 Heart failure, unspecified: Secondary | ICD-10-CM | POA: Diagnosis not present

## 2016-02-12 DIAGNOSIS — Z6835 Body mass index (BMI) 35.0-35.9, adult: Secondary | ICD-10-CM | POA: Diagnosis not present

## 2016-02-12 DIAGNOSIS — Z1389 Encounter for screening for other disorder: Secondary | ICD-10-CM | POA: Diagnosis not present

## 2016-02-16 DIAGNOSIS — Z6835 Body mass index (BMI) 35.0-35.9, adult: Secondary | ICD-10-CM | POA: Diagnosis not present

## 2016-02-16 DIAGNOSIS — I509 Heart failure, unspecified: Secondary | ICD-10-CM | POA: Diagnosis not present

## 2016-02-16 DIAGNOSIS — R0602 Shortness of breath: Secondary | ICD-10-CM | POA: Diagnosis not present

## 2016-03-03 DIAGNOSIS — Z79899 Other long term (current) drug therapy: Secondary | ICD-10-CM | POA: Diagnosis not present

## 2016-03-03 DIAGNOSIS — Z7982 Long term (current) use of aspirin: Secondary | ICD-10-CM | POA: Diagnosis not present

## 2016-03-03 DIAGNOSIS — G894 Chronic pain syndrome: Secondary | ICD-10-CM | POA: Diagnosis not present

## 2016-03-16 DIAGNOSIS — I10 Essential (primary) hypertension: Secondary | ICD-10-CM | POA: Diagnosis not present

## 2016-03-16 DIAGNOSIS — E1159 Type 2 diabetes mellitus with other circulatory complications: Secondary | ICD-10-CM | POA: Diagnosis not present

## 2016-03-16 DIAGNOSIS — I6523 Occlusion and stenosis of bilateral carotid arteries: Secondary | ICD-10-CM | POA: Insufficient documentation

## 2016-03-16 DIAGNOSIS — E785 Hyperlipidemia, unspecified: Secondary | ICD-10-CM | POA: Diagnosis not present

## 2016-03-16 DIAGNOSIS — Z95820 Peripheral vascular angioplasty status with implants and grafts: Secondary | ICD-10-CM | POA: Diagnosis not present

## 2016-03-16 HISTORY — DX: Occlusion and stenosis of bilateral carotid arteries: I65.23

## 2016-06-09 DIAGNOSIS — M5127 Other intervertebral disc displacement, lumbosacral region: Secondary | ICD-10-CM | POA: Diagnosis not present

## 2016-06-09 DIAGNOSIS — M5441 Lumbago with sciatica, right side: Secondary | ICD-10-CM | POA: Diagnosis not present

## 2016-06-09 DIAGNOSIS — M48061 Spinal stenosis, lumbar region without neurogenic claudication: Secondary | ICD-10-CM | POA: Diagnosis not present

## 2016-06-09 DIAGNOSIS — M5442 Lumbago with sciatica, left side: Secondary | ICD-10-CM | POA: Diagnosis not present

## 2016-06-09 DIAGNOSIS — G8929 Other chronic pain: Secondary | ICD-10-CM | POA: Diagnosis not present

## 2016-06-09 DIAGNOSIS — G894 Chronic pain syndrome: Secondary | ICD-10-CM | POA: Diagnosis not present

## 2016-06-09 DIAGNOSIS — Z79899 Other long term (current) drug therapy: Secondary | ICD-10-CM | POA: Diagnosis not present

## 2016-07-23 DIAGNOSIS — E785 Hyperlipidemia, unspecified: Secondary | ICD-10-CM | POA: Diagnosis not present

## 2016-07-23 DIAGNOSIS — R6889 Other general symptoms and signs: Secondary | ICD-10-CM | POA: Diagnosis not present

## 2016-07-23 DIAGNOSIS — G4700 Insomnia, unspecified: Secondary | ICD-10-CM | POA: Diagnosis not present

## 2016-07-23 DIAGNOSIS — R0602 Shortness of breath: Secondary | ICD-10-CM | POA: Diagnosis not present

## 2016-07-23 DIAGNOSIS — J189 Pneumonia, unspecified organism: Secondary | ICD-10-CM | POA: Diagnosis not present

## 2016-07-23 DIAGNOSIS — J449 Chronic obstructive pulmonary disease, unspecified: Secondary | ICD-10-CM | POA: Diagnosis not present

## 2016-07-23 DIAGNOSIS — Z6838 Body mass index (BMI) 38.0-38.9, adult: Secondary | ICD-10-CM | POA: Diagnosis not present

## 2016-07-23 DIAGNOSIS — Z716 Tobacco abuse counseling: Secondary | ICD-10-CM | POA: Diagnosis not present

## 2016-07-23 DIAGNOSIS — M545 Low back pain: Secondary | ICD-10-CM | POA: Diagnosis not present

## 2016-07-23 DIAGNOSIS — I2510 Atherosclerotic heart disease of native coronary artery without angina pectoris: Secondary | ICD-10-CM | POA: Diagnosis not present

## 2016-07-23 DIAGNOSIS — E669 Obesity, unspecified: Secondary | ICD-10-CM | POA: Diagnosis not present

## 2016-07-23 DIAGNOSIS — I50810 Right heart failure, unspecified: Secondary | ICD-10-CM | POA: Diagnosis not present

## 2016-07-23 DIAGNOSIS — E119 Type 2 diabetes mellitus without complications: Secondary | ICD-10-CM | POA: Diagnosis not present

## 2016-08-16 DIAGNOSIS — J189 Pneumonia, unspecified organism: Secondary | ICD-10-CM | POA: Diagnosis not present

## 2016-08-16 DIAGNOSIS — R0602 Shortness of breath: Secondary | ICD-10-CM | POA: Diagnosis not present

## 2016-08-16 DIAGNOSIS — M545 Low back pain: Secondary | ICD-10-CM | POA: Diagnosis not present

## 2016-08-16 DIAGNOSIS — R6889 Other general symptoms and signs: Secondary | ICD-10-CM | POA: Diagnosis not present

## 2016-08-16 DIAGNOSIS — Z6838 Body mass index (BMI) 38.0-38.9, adult: Secondary | ICD-10-CM | POA: Diagnosis not present

## 2016-08-16 DIAGNOSIS — E785 Hyperlipidemia, unspecified: Secondary | ICD-10-CM | POA: Diagnosis not present

## 2016-08-16 DIAGNOSIS — Z716 Tobacco abuse counseling: Secondary | ICD-10-CM | POA: Diagnosis not present

## 2016-08-16 DIAGNOSIS — G4730 Sleep apnea, unspecified: Secondary | ICD-10-CM | POA: Diagnosis not present

## 2016-08-16 DIAGNOSIS — G4700 Insomnia, unspecified: Secondary | ICD-10-CM | POA: Diagnosis not present

## 2016-08-16 DIAGNOSIS — I2510 Atherosclerotic heart disease of native coronary artery without angina pectoris: Secondary | ICD-10-CM | POA: Diagnosis not present

## 2016-08-16 DIAGNOSIS — J449 Chronic obstructive pulmonary disease, unspecified: Secondary | ICD-10-CM | POA: Diagnosis not present

## 2016-08-17 DIAGNOSIS — R7989 Other specified abnormal findings of blood chemistry: Secondary | ICD-10-CM | POA: Diagnosis not present

## 2016-08-26 DIAGNOSIS — I50810 Right heart failure, unspecified: Secondary | ICD-10-CM | POA: Diagnosis not present

## 2016-08-26 DIAGNOSIS — N529 Male erectile dysfunction, unspecified: Secondary | ICD-10-CM | POA: Diagnosis not present

## 2016-08-26 DIAGNOSIS — Z6838 Body mass index (BMI) 38.0-38.9, adult: Secondary | ICD-10-CM | POA: Diagnosis not present

## 2016-08-26 DIAGNOSIS — G4700 Insomnia, unspecified: Secondary | ICD-10-CM | POA: Diagnosis not present

## 2016-08-26 DIAGNOSIS — I2510 Atherosclerotic heart disease of native coronary artery without angina pectoris: Secondary | ICD-10-CM | POA: Diagnosis not present

## 2016-08-26 DIAGNOSIS — E785 Hyperlipidemia, unspecified: Secondary | ICD-10-CM | POA: Diagnosis not present

## 2016-08-26 DIAGNOSIS — M545 Low back pain: Secondary | ICD-10-CM | POA: Diagnosis not present

## 2016-08-26 DIAGNOSIS — Z716 Tobacco abuse counseling: Secondary | ICD-10-CM | POA: Diagnosis not present

## 2016-08-26 DIAGNOSIS — I10 Essential (primary) hypertension: Secondary | ICD-10-CM | POA: Diagnosis not present

## 2016-08-26 DIAGNOSIS — J449 Chronic obstructive pulmonary disease, unspecified: Secondary | ICD-10-CM | POA: Diagnosis not present

## 2016-09-02 DIAGNOSIS — G894 Chronic pain syndrome: Secondary | ICD-10-CM | POA: Diagnosis not present

## 2016-09-23 DIAGNOSIS — Z6838 Body mass index (BMI) 38.0-38.9, adult: Secondary | ICD-10-CM | POA: Diagnosis not present

## 2016-09-23 DIAGNOSIS — J449 Chronic obstructive pulmonary disease, unspecified: Secondary | ICD-10-CM | POA: Diagnosis not present

## 2016-09-23 DIAGNOSIS — M545 Low back pain: Secondary | ICD-10-CM | POA: Diagnosis not present

## 2016-09-23 DIAGNOSIS — J189 Pneumonia, unspecified organism: Secondary | ICD-10-CM | POA: Diagnosis not present

## 2016-09-23 DIAGNOSIS — Z716 Tobacco abuse counseling: Secondary | ICD-10-CM | POA: Diagnosis not present

## 2016-09-23 DIAGNOSIS — E669 Obesity, unspecified: Secondary | ICD-10-CM | POA: Diagnosis not present

## 2016-09-28 DIAGNOSIS — G4730 Sleep apnea, unspecified: Secondary | ICD-10-CM | POA: Diagnosis not present

## 2016-10-19 DIAGNOSIS — M961 Postlaminectomy syndrome, not elsewhere classified: Secondary | ICD-10-CM | POA: Diagnosis not present

## 2016-10-19 DIAGNOSIS — M5136 Other intervertebral disc degeneration, lumbar region: Secondary | ICD-10-CM | POA: Diagnosis not present

## 2016-10-19 DIAGNOSIS — M5416 Radiculopathy, lumbar region: Secondary | ICD-10-CM | POA: Diagnosis not present

## 2016-10-19 DIAGNOSIS — J449 Chronic obstructive pulmonary disease, unspecified: Secondary | ICD-10-CM | POA: Diagnosis not present

## 2016-10-19 DIAGNOSIS — Z720 Tobacco use: Secondary | ICD-10-CM | POA: Diagnosis not present

## 2016-10-19 DIAGNOSIS — G894 Chronic pain syndrome: Secondary | ICD-10-CM | POA: Diagnosis not present

## 2016-10-29 DIAGNOSIS — Z6838 Body mass index (BMI) 38.0-38.9, adult: Secondary | ICD-10-CM | POA: Diagnosis not present

## 2016-10-29 DIAGNOSIS — I10 Essential (primary) hypertension: Secondary | ICD-10-CM | POA: Diagnosis not present

## 2016-10-29 DIAGNOSIS — I2510 Atherosclerotic heart disease of native coronary artery without angina pectoris: Secondary | ICD-10-CM | POA: Diagnosis not present

## 2016-10-29 DIAGNOSIS — E669 Obesity, unspecified: Secondary | ICD-10-CM | POA: Diagnosis not present

## 2016-10-29 DIAGNOSIS — E119 Type 2 diabetes mellitus without complications: Secondary | ICD-10-CM | POA: Diagnosis not present

## 2016-10-29 DIAGNOSIS — J449 Chronic obstructive pulmonary disease, unspecified: Secondary | ICD-10-CM | POA: Diagnosis not present

## 2016-10-29 DIAGNOSIS — Z716 Tobacco abuse counseling: Secondary | ICD-10-CM | POA: Diagnosis not present

## 2016-10-29 DIAGNOSIS — I50810 Right heart failure, unspecified: Secondary | ICD-10-CM | POA: Diagnosis not present

## 2016-12-01 DIAGNOSIS — F329 Major depressive disorder, single episode, unspecified: Secondary | ICD-10-CM | POA: Diagnosis not present

## 2016-12-01 DIAGNOSIS — G8929 Other chronic pain: Secondary | ICD-10-CM | POA: Diagnosis not present

## 2016-12-01 DIAGNOSIS — I2510 Atherosclerotic heart disease of native coronary artery without angina pectoris: Secondary | ICD-10-CM | POA: Diagnosis not present

## 2016-12-01 DIAGNOSIS — R079 Chest pain, unspecified: Secondary | ICD-10-CM | POA: Diagnosis not present

## 2016-12-01 DIAGNOSIS — F1410 Cocaine abuse, uncomplicated: Secondary | ICD-10-CM | POA: Diagnosis not present

## 2016-12-01 DIAGNOSIS — I10 Essential (primary) hypertension: Secondary | ICD-10-CM | POA: Diagnosis not present

## 2016-12-08 DIAGNOSIS — I6523 Occlusion and stenosis of bilateral carotid arteries: Secondary | ICD-10-CM | POA: Diagnosis not present

## 2016-12-08 DIAGNOSIS — Z95820 Peripheral vascular angioplasty status with implants and grafts: Secondary | ICD-10-CM | POA: Diagnosis not present

## 2016-12-21 DIAGNOSIS — Z720 Tobacco use: Secondary | ICD-10-CM | POA: Diagnosis not present

## 2016-12-21 DIAGNOSIS — J449 Chronic obstructive pulmonary disease, unspecified: Secondary | ICD-10-CM | POA: Diagnosis not present

## 2016-12-21 DIAGNOSIS — M5416 Radiculopathy, lumbar region: Secondary | ICD-10-CM | POA: Diagnosis not present

## 2016-12-21 DIAGNOSIS — M5136 Other intervertebral disc degeneration, lumbar region: Secondary | ICD-10-CM | POA: Diagnosis not present

## 2016-12-21 DIAGNOSIS — M961 Postlaminectomy syndrome, not elsewhere classified: Secondary | ICD-10-CM | POA: Diagnosis not present

## 2016-12-21 DIAGNOSIS — G894 Chronic pain syndrome: Secondary | ICD-10-CM | POA: Diagnosis not present

## 2017-01-18 DIAGNOSIS — Z720 Tobacco use: Secondary | ICD-10-CM | POA: Diagnosis not present

## 2017-01-18 DIAGNOSIS — M5136 Other intervertebral disc degeneration, lumbar region: Secondary | ICD-10-CM | POA: Diagnosis not present

## 2017-01-18 DIAGNOSIS — J449 Chronic obstructive pulmonary disease, unspecified: Secondary | ICD-10-CM | POA: Diagnosis not present

## 2017-01-18 DIAGNOSIS — G894 Chronic pain syndrome: Secondary | ICD-10-CM | POA: Diagnosis not present

## 2017-01-18 DIAGNOSIS — M5416 Radiculopathy, lumbar region: Secondary | ICD-10-CM | POA: Diagnosis not present

## 2017-01-18 DIAGNOSIS — M961 Postlaminectomy syndrome, not elsewhere classified: Secondary | ICD-10-CM | POA: Diagnosis not present

## 2017-04-08 DIAGNOSIS — I10 Essential (primary) hypertension: Secondary | ICD-10-CM | POA: Diagnosis not present

## 2017-04-08 DIAGNOSIS — I2510 Atherosclerotic heart disease of native coronary artery without angina pectoris: Secondary | ICD-10-CM | POA: Diagnosis not present

## 2017-04-08 DIAGNOSIS — I50810 Right heart failure, unspecified: Secondary | ICD-10-CM | POA: Diagnosis not present

## 2017-04-08 DIAGNOSIS — Z125 Encounter for screening for malignant neoplasm of prostate: Secondary | ICD-10-CM | POA: Diagnosis not present

## 2017-04-08 DIAGNOSIS — Z6839 Body mass index (BMI) 39.0-39.9, adult: Secondary | ICD-10-CM | POA: Diagnosis not present

## 2017-04-08 DIAGNOSIS — G4700 Insomnia, unspecified: Secondary | ICD-10-CM | POA: Diagnosis not present

## 2017-04-08 DIAGNOSIS — E119 Type 2 diabetes mellitus without complications: Secondary | ICD-10-CM | POA: Diagnosis not present

## 2017-04-08 DIAGNOSIS — J449 Chronic obstructive pulmonary disease, unspecified: Secondary | ICD-10-CM | POA: Diagnosis not present

## 2017-04-08 DIAGNOSIS — E785 Hyperlipidemia, unspecified: Secondary | ICD-10-CM | POA: Diagnosis not present

## 2017-05-06 DIAGNOSIS — E669 Obesity, unspecified: Secondary | ICD-10-CM | POA: Diagnosis not present

## 2017-05-06 DIAGNOSIS — Z6838 Body mass index (BMI) 38.0-38.9, adult: Secondary | ICD-10-CM | POA: Diagnosis not present

## 2017-05-06 DIAGNOSIS — L0231 Cutaneous abscess of buttock: Secondary | ICD-10-CM | POA: Diagnosis not present

## 2017-07-12 DIAGNOSIS — Z716 Tobacco abuse counseling: Secondary | ICD-10-CM | POA: Diagnosis not present

## 2017-07-12 DIAGNOSIS — J189 Pneumonia, unspecified organism: Secondary | ICD-10-CM | POA: Diagnosis not present

## 2017-07-12 DIAGNOSIS — Z6839 Body mass index (BMI) 39.0-39.9, adult: Secondary | ICD-10-CM | POA: Diagnosis not present

## 2017-07-12 DIAGNOSIS — J449 Chronic obstructive pulmonary disease, unspecified: Secondary | ICD-10-CM | POA: Diagnosis not present

## 2017-09-15 DIAGNOSIS — I6789 Other cerebrovascular disease: Secondary | ICD-10-CM | POA: Diagnosis not present

## 2017-09-15 DIAGNOSIS — R55 Syncope and collapse: Secondary | ICD-10-CM | POA: Diagnosis not present

## 2017-09-15 DIAGNOSIS — R404 Transient alteration of awareness: Secondary | ICD-10-CM | POA: Diagnosis not present

## 2017-09-15 DIAGNOSIS — S0990XA Unspecified injury of head, initial encounter: Secondary | ICD-10-CM | POA: Diagnosis not present

## 2017-09-15 DIAGNOSIS — R079 Chest pain, unspecified: Secondary | ICD-10-CM | POA: Diagnosis not present

## 2017-10-11 DIAGNOSIS — R0602 Shortness of breath: Secondary | ICD-10-CM | POA: Diagnosis not present

## 2017-10-11 DIAGNOSIS — R079 Chest pain, unspecified: Secondary | ICD-10-CM | POA: Diagnosis not present

## 2017-11-03 DIAGNOSIS — E785 Hyperlipidemia, unspecified: Secondary | ICD-10-CM | POA: Diagnosis not present

## 2017-11-03 DIAGNOSIS — I10 Essential (primary) hypertension: Secondary | ICD-10-CM | POA: Diagnosis not present

## 2017-11-03 DIAGNOSIS — E119 Type 2 diabetes mellitus without complications: Secondary | ICD-10-CM | POA: Diagnosis not present

## 2017-11-03 DIAGNOSIS — I2510 Atherosclerotic heart disease of native coronary artery without angina pectoris: Secondary | ICD-10-CM | POA: Diagnosis not present

## 2017-11-03 DIAGNOSIS — F329 Major depressive disorder, single episode, unspecified: Secondary | ICD-10-CM | POA: Diagnosis not present

## 2017-11-03 DIAGNOSIS — R079 Chest pain, unspecified: Secondary | ICD-10-CM | POA: Diagnosis not present

## 2017-11-03 DIAGNOSIS — F1410 Cocaine abuse, uncomplicated: Secondary | ICD-10-CM | POA: Diagnosis not present

## 2017-11-04 DIAGNOSIS — R0789 Other chest pain: Secondary | ICD-10-CM | POA: Diagnosis not present

## 2017-11-04 DIAGNOSIS — I10 Essential (primary) hypertension: Secondary | ICD-10-CM | POA: Diagnosis not present

## 2017-11-04 DIAGNOSIS — E119 Type 2 diabetes mellitus without complications: Secondary | ICD-10-CM | POA: Diagnosis not present

## 2017-11-04 DIAGNOSIS — F1410 Cocaine abuse, uncomplicated: Secondary | ICD-10-CM | POA: Diagnosis not present

## 2017-11-04 DIAGNOSIS — E785 Hyperlipidemia, unspecified: Secondary | ICD-10-CM | POA: Diagnosis not present

## 2017-11-04 DIAGNOSIS — F329 Major depressive disorder, single episode, unspecified: Secondary | ICD-10-CM | POA: Diagnosis not present

## 2017-11-04 DIAGNOSIS — I2510 Atherosclerotic heart disease of native coronary artery without angina pectoris: Secondary | ICD-10-CM | POA: Diagnosis not present

## 2017-11-04 DIAGNOSIS — R079 Chest pain, unspecified: Secondary | ICD-10-CM | POA: Diagnosis not present

## 2018-02-21 DIAGNOSIS — J189 Pneumonia, unspecified organism: Secondary | ICD-10-CM | POA: Diagnosis not present

## 2018-02-21 DIAGNOSIS — E118 Type 2 diabetes mellitus with unspecified complications: Secondary | ICD-10-CM | POA: Diagnosis not present

## 2018-02-21 DIAGNOSIS — E785 Hyperlipidemia, unspecified: Secondary | ICD-10-CM | POA: Diagnosis not present

## 2018-02-21 DIAGNOSIS — J449 Chronic obstructive pulmonary disease, unspecified: Secondary | ICD-10-CM | POA: Diagnosis not present

## 2018-02-21 DIAGNOSIS — I10 Essential (primary) hypertension: Secondary | ICD-10-CM | POA: Diagnosis not present

## 2018-02-21 DIAGNOSIS — E669 Obesity, unspecified: Secondary | ICD-10-CM | POA: Diagnosis not present

## 2018-02-21 DIAGNOSIS — Z125 Encounter for screening for malignant neoplasm of prostate: Secondary | ICD-10-CM | POA: Diagnosis not present

## 2018-03-27 ENCOUNTER — Other Ambulatory Visit: Payer: Self-pay | Admitting: Cardiology

## 2018-04-12 DIAGNOSIS — M545 Low back pain: Secondary | ICD-10-CM | POA: Diagnosis not present

## 2018-04-12 DIAGNOSIS — R05 Cough: Secondary | ICD-10-CM | POA: Diagnosis not present

## 2018-04-12 DIAGNOSIS — G894 Chronic pain syndrome: Secondary | ICD-10-CM | POA: Diagnosis not present

## 2018-04-12 DIAGNOSIS — Z79891 Long term (current) use of opiate analgesic: Secondary | ICD-10-CM | POA: Diagnosis not present

## 2018-04-13 DIAGNOSIS — R05 Cough: Secondary | ICD-10-CM | POA: Diagnosis not present

## 2018-04-19 DIAGNOSIS — R05 Cough: Secondary | ICD-10-CM | POA: Diagnosis not present

## 2018-04-25 DIAGNOSIS — S2232XD Fracture of one rib, left side, subsequent encounter for fracture with routine healing: Secondary | ICD-10-CM | POA: Diagnosis not present

## 2018-04-25 DIAGNOSIS — R918 Other nonspecific abnormal finding of lung field: Secondary | ICD-10-CM | POA: Diagnosis not present

## 2018-04-26 DIAGNOSIS — M545 Low back pain: Secondary | ICD-10-CM | POA: Diagnosis not present

## 2018-04-26 DIAGNOSIS — G894 Chronic pain syndrome: Secondary | ICD-10-CM | POA: Diagnosis not present

## 2018-04-26 DIAGNOSIS — E1165 Type 2 diabetes mellitus with hyperglycemia: Secondary | ICD-10-CM | POA: Diagnosis not present

## 2018-04-26 DIAGNOSIS — Z79891 Long term (current) use of opiate analgesic: Secondary | ICD-10-CM | POA: Diagnosis not present

## 2018-04-26 DIAGNOSIS — J4540 Moderate persistent asthma, uncomplicated: Secondary | ICD-10-CM | POA: Diagnosis not present

## 2018-05-05 DIAGNOSIS — Z79891 Long term (current) use of opiate analgesic: Secondary | ICD-10-CM | POA: Diagnosis not present

## 2018-05-05 DIAGNOSIS — E1165 Type 2 diabetes mellitus with hyperglycemia: Secondary | ICD-10-CM | POA: Diagnosis not present

## 2018-05-05 DIAGNOSIS — J4540 Moderate persistent asthma, uncomplicated: Secondary | ICD-10-CM | POA: Diagnosis not present

## 2018-05-05 DIAGNOSIS — G894 Chronic pain syndrome: Secondary | ICD-10-CM | POA: Diagnosis not present

## 2018-05-05 DIAGNOSIS — M545 Low back pain: Secondary | ICD-10-CM | POA: Diagnosis not present

## 2018-10-18 DIAGNOSIS — M129 Arthropathy, unspecified: Secondary | ICD-10-CM | POA: Diagnosis not present

## 2018-10-18 DIAGNOSIS — I10 Essential (primary) hypertension: Secondary | ICD-10-CM | POA: Diagnosis not present

## 2018-10-18 DIAGNOSIS — F17200 Nicotine dependence, unspecified, uncomplicated: Secondary | ICD-10-CM | POA: Diagnosis not present

## 2018-10-18 DIAGNOSIS — Z79899 Other long term (current) drug therapy: Secondary | ICD-10-CM | POA: Diagnosis not present

## 2018-10-18 DIAGNOSIS — M545 Low back pain: Secondary | ICD-10-CM | POA: Diagnosis not present

## 2018-10-18 DIAGNOSIS — M542 Cervicalgia: Secondary | ICD-10-CM | POA: Diagnosis not present

## 2018-10-18 DIAGNOSIS — F17210 Nicotine dependence, cigarettes, uncomplicated: Secondary | ICD-10-CM | POA: Diagnosis not present

## 2018-10-18 DIAGNOSIS — E559 Vitamin D deficiency, unspecified: Secondary | ICD-10-CM | POA: Diagnosis not present

## 2018-10-30 DIAGNOSIS — F17210 Nicotine dependence, cigarettes, uncomplicated: Secondary | ICD-10-CM | POA: Diagnosis not present

## 2018-10-30 DIAGNOSIS — I2510 Atherosclerotic heart disease of native coronary artery without angina pectoris: Secondary | ICD-10-CM | POA: Diagnosis not present

## 2018-10-30 DIAGNOSIS — Z87891 Personal history of nicotine dependence: Secondary | ICD-10-CM | POA: Diagnosis not present

## 2018-10-30 DIAGNOSIS — D539 Nutritional anemia, unspecified: Secondary | ICD-10-CM | POA: Diagnosis not present

## 2018-10-30 DIAGNOSIS — Z0000 Encounter for general adult medical examination without abnormal findings: Secondary | ICD-10-CM | POA: Diagnosis not present

## 2018-10-30 DIAGNOSIS — J449 Chronic obstructive pulmonary disease, unspecified: Secondary | ICD-10-CM | POA: Diagnosis not present

## 2018-10-30 DIAGNOSIS — R5383 Other fatigue: Secondary | ICD-10-CM | POA: Diagnosis not present

## 2018-10-30 DIAGNOSIS — Z6841 Body Mass Index (BMI) 40.0 and over, adult: Secondary | ICD-10-CM | POA: Diagnosis not present

## 2018-10-30 DIAGNOSIS — I10 Essential (primary) hypertension: Secondary | ICD-10-CM | POA: Diagnosis not present

## 2018-10-30 DIAGNOSIS — Z79899 Other long term (current) drug therapy: Secondary | ICD-10-CM | POA: Diagnosis not present

## 2018-10-30 DIAGNOSIS — I779 Disorder of arteries and arterioles, unspecified: Secondary | ICD-10-CM | POA: Diagnosis not present

## 2018-10-30 DIAGNOSIS — E7800 Pure hypercholesterolemia, unspecified: Secondary | ICD-10-CM | POA: Diagnosis not present

## 2018-10-31 DIAGNOSIS — M546 Pain in thoracic spine: Secondary | ICD-10-CM | POA: Diagnosis not present

## 2018-10-31 DIAGNOSIS — M545 Low back pain: Secondary | ICD-10-CM | POA: Diagnosis not present

## 2018-11-01 DIAGNOSIS — G8929 Other chronic pain: Secondary | ICD-10-CM | POA: Diagnosis not present

## 2018-11-01 DIAGNOSIS — Z79899 Other long term (current) drug therapy: Secondary | ICD-10-CM | POA: Diagnosis not present

## 2018-11-01 DIAGNOSIS — F17210 Nicotine dependence, cigarettes, uncomplicated: Secondary | ICD-10-CM | POA: Diagnosis not present

## 2018-11-01 DIAGNOSIS — M545 Low back pain: Secondary | ICD-10-CM | POA: Diagnosis not present

## 2018-11-06 DIAGNOSIS — I779 Disorder of arteries and arterioles, unspecified: Secondary | ICD-10-CM | POA: Diagnosis not present

## 2018-11-06 DIAGNOSIS — I6523 Occlusion and stenosis of bilateral carotid arteries: Secondary | ICD-10-CM | POA: Diagnosis not present

## 2018-11-09 DIAGNOSIS — K2990 Gastroduodenitis, unspecified, without bleeding: Secondary | ICD-10-CM | POA: Diagnosis not present

## 2018-11-09 DIAGNOSIS — M545 Low back pain: Secondary | ICD-10-CM | POA: Diagnosis not present

## 2018-11-09 DIAGNOSIS — A048 Other specified bacterial intestinal infections: Secondary | ICD-10-CM | POA: Diagnosis not present

## 2018-11-09 DIAGNOSIS — I779 Disorder of arteries and arterioles, unspecified: Secondary | ICD-10-CM | POA: Diagnosis not present

## 2018-11-10 DIAGNOSIS — E782 Mixed hyperlipidemia: Secondary | ICD-10-CM | POA: Diagnosis not present

## 2018-11-10 DIAGNOSIS — I7789 Other specified disorders of arteries and arterioles: Secondary | ICD-10-CM | POA: Diagnosis not present

## 2018-11-10 DIAGNOSIS — I2510 Atherosclerotic heart disease of native coronary artery without angina pectoris: Secondary | ICD-10-CM | POA: Diagnosis not present

## 2018-11-10 DIAGNOSIS — I10 Essential (primary) hypertension: Secondary | ICD-10-CM | POA: Diagnosis not present

## 2018-11-10 DIAGNOSIS — F17210 Nicotine dependence, cigarettes, uncomplicated: Secondary | ICD-10-CM | POA: Diagnosis not present

## 2018-11-29 DIAGNOSIS — G8929 Other chronic pain: Secondary | ICD-10-CM | POA: Diagnosis not present

## 2018-11-29 DIAGNOSIS — M545 Low back pain: Secondary | ICD-10-CM | POA: Diagnosis not present

## 2018-11-29 DIAGNOSIS — I10 Essential (primary) hypertension: Secondary | ICD-10-CM | POA: Diagnosis not present

## 2018-11-29 DIAGNOSIS — Z79899 Other long term (current) drug therapy: Secondary | ICD-10-CM | POA: Diagnosis not present

## 2018-12-20 DIAGNOSIS — Z79899 Other long term (current) drug therapy: Secondary | ICD-10-CM | POA: Diagnosis not present

## 2018-12-20 DIAGNOSIS — G8929 Other chronic pain: Secondary | ICD-10-CM | POA: Diagnosis not present

## 2018-12-20 DIAGNOSIS — M545 Low back pain: Secondary | ICD-10-CM | POA: Diagnosis not present

## 2018-12-25 DIAGNOSIS — I10 Essential (primary) hypertension: Secondary | ICD-10-CM

## 2018-12-25 DIAGNOSIS — Z6839 Body mass index (BMI) 39.0-39.9, adult: Secondary | ICD-10-CM

## 2018-12-25 DIAGNOSIS — E785 Hyperlipidemia, unspecified: Secondary | ICD-10-CM

## 2018-12-25 DIAGNOSIS — E119 Type 2 diabetes mellitus without complications: Secondary | ICD-10-CM

## 2018-12-25 DIAGNOSIS — R5381 Other malaise: Secondary | ICD-10-CM

## 2018-12-25 DIAGNOSIS — R5383 Other fatigue: Secondary | ICD-10-CM | POA: Insufficient documentation

## 2018-12-25 HISTORY — DX: Body mass index (BMI) 39.0-39.9, adult: Z68.39

## 2018-12-25 HISTORY — DX: Other fatigue: R53.81

## 2018-12-25 HISTORY — DX: Essential (primary) hypertension: I10

## 2018-12-25 HISTORY — DX: Type 2 diabetes mellitus without complications: E11.9

## 2018-12-25 HISTORY — DX: Hyperlipidemia, unspecified: E78.5

## 2019-01-03 DIAGNOSIS — M545 Low back pain: Secondary | ICD-10-CM | POA: Diagnosis not present

## 2019-01-03 DIAGNOSIS — G8929 Other chronic pain: Secondary | ICD-10-CM | POA: Diagnosis not present

## 2019-01-03 DIAGNOSIS — Z79899 Other long term (current) drug therapy: Secondary | ICD-10-CM | POA: Diagnosis not present

## 2019-01-17 DIAGNOSIS — G8929 Other chronic pain: Secondary | ICD-10-CM | POA: Diagnosis not present

## 2019-01-17 DIAGNOSIS — Z79899 Other long term (current) drug therapy: Secondary | ICD-10-CM | POA: Diagnosis not present

## 2019-01-17 DIAGNOSIS — M545 Low back pain: Secondary | ICD-10-CM | POA: Diagnosis not present

## 2019-01-29 DIAGNOSIS — I10 Essential (primary) hypertension: Secondary | ICD-10-CM | POA: Diagnosis not present

## 2019-01-29 DIAGNOSIS — Z6839 Body mass index (BMI) 39.0-39.9, adult: Secondary | ICD-10-CM | POA: Diagnosis not present

## 2019-01-29 DIAGNOSIS — Z13820 Encounter for screening for osteoporosis: Secondary | ICD-10-CM

## 2019-01-29 DIAGNOSIS — Z23 Encounter for immunization: Secondary | ICD-10-CM

## 2019-01-29 DIAGNOSIS — E785 Hyperlipidemia, unspecified: Secondary | ICD-10-CM | POA: Diagnosis not present

## 2019-01-29 DIAGNOSIS — E119 Type 2 diabetes mellitus without complications: Secondary | ICD-10-CM | POA: Diagnosis not present

## 2019-01-29 DIAGNOSIS — R5381 Other malaise: Secondary | ICD-10-CM | POA: Diagnosis not present

## 2019-01-29 HISTORY — DX: Encounter for screening for osteoporosis: Z13.820

## 2019-01-29 HISTORY — DX: Encounter for immunization: Z23

## 2019-01-31 DIAGNOSIS — I739 Peripheral vascular disease, unspecified: Secondary | ICD-10-CM

## 2019-01-31 DIAGNOSIS — G8929 Other chronic pain: Secondary | ICD-10-CM | POA: Diagnosis not present

## 2019-01-31 DIAGNOSIS — M545 Low back pain: Secondary | ICD-10-CM | POA: Diagnosis not present

## 2019-01-31 DIAGNOSIS — Z79899 Other long term (current) drug therapy: Secondary | ICD-10-CM | POA: Diagnosis not present

## 2019-01-31 DIAGNOSIS — E119 Type 2 diabetes mellitus without complications: Secondary | ICD-10-CM | POA: Diagnosis not present

## 2019-01-31 DIAGNOSIS — R5381 Other malaise: Secondary | ICD-10-CM | POA: Diagnosis not present

## 2019-01-31 DIAGNOSIS — I10 Essential (primary) hypertension: Secondary | ICD-10-CM | POA: Diagnosis not present

## 2019-01-31 HISTORY — DX: Peripheral vascular disease, unspecified: I73.9

## 2019-02-09 DIAGNOSIS — I10 Essential (primary) hypertension: Secondary | ICD-10-CM | POA: Diagnosis not present

## 2019-02-09 DIAGNOSIS — Z1339 Encounter for screening examination for other mental health and behavioral disorders: Secondary | ICD-10-CM | POA: Insufficient documentation

## 2019-02-09 DIAGNOSIS — Z0000 Encounter for general adult medical examination without abnormal findings: Secondary | ICD-10-CM | POA: Insufficient documentation

## 2019-02-09 DIAGNOSIS — R5381 Other malaise: Secondary | ICD-10-CM | POA: Diagnosis not present

## 2019-02-09 DIAGNOSIS — Z6838 Body mass index (BMI) 38.0-38.9, adult: Secondary | ICD-10-CM

## 2019-02-09 DIAGNOSIS — Z008 Encounter for other general examination: Secondary | ICD-10-CM | POA: Diagnosis not present

## 2019-02-09 DIAGNOSIS — E119 Type 2 diabetes mellitus without complications: Secondary | ICD-10-CM | POA: Diagnosis not present

## 2019-02-09 DIAGNOSIS — E785 Hyperlipidemia, unspecified: Secondary | ICD-10-CM | POA: Diagnosis not present

## 2019-02-09 DIAGNOSIS — Z1331 Encounter for screening for depression: Secondary | ICD-10-CM

## 2019-02-09 HISTORY — DX: Body mass index (BMI) 38.0-38.9, adult: Z68.38

## 2019-02-09 HISTORY — DX: Encounter for general adult medical examination without abnormal findings: Z00.00

## 2019-02-09 HISTORY — DX: Encounter for screening examination for other mental health and behavioral disorders: Z13.39

## 2019-02-09 HISTORY — DX: Encounter for screening for depression: Z13.31

## 2019-02-16 DIAGNOSIS — I10 Essential (primary) hypertension: Secondary | ICD-10-CM | POA: Diagnosis not present

## 2019-02-16 DIAGNOSIS — I739 Peripheral vascular disease, unspecified: Secondary | ICD-10-CM | POA: Diagnosis not present

## 2019-02-16 DIAGNOSIS — E119 Type 2 diabetes mellitus without complications: Secondary | ICD-10-CM | POA: Diagnosis not present

## 2019-02-16 DIAGNOSIS — E785 Hyperlipidemia, unspecified: Secondary | ICD-10-CM | POA: Diagnosis not present

## 2019-03-02 DIAGNOSIS — G8929 Other chronic pain: Secondary | ICD-10-CM | POA: Diagnosis not present

## 2019-03-02 DIAGNOSIS — F17210 Nicotine dependence, cigarettes, uncomplicated: Secondary | ICD-10-CM | POA: Diagnosis not present

## 2019-03-02 DIAGNOSIS — Z79899 Other long term (current) drug therapy: Secondary | ICD-10-CM | POA: Diagnosis not present

## 2019-03-02 DIAGNOSIS — M545 Low back pain: Secondary | ICD-10-CM | POA: Diagnosis not present

## 2019-03-09 DIAGNOSIS — E785 Hyperlipidemia, unspecified: Secondary | ICD-10-CM | POA: Diagnosis not present

## 2019-03-09 DIAGNOSIS — Z23 Encounter for immunization: Secondary | ICD-10-CM | POA: Insufficient documentation

## 2019-03-09 DIAGNOSIS — R350 Frequency of micturition: Secondary | ICD-10-CM | POA: Insufficient documentation

## 2019-03-09 DIAGNOSIS — I10 Essential (primary) hypertension: Secondary | ICD-10-CM | POA: Diagnosis not present

## 2019-03-09 DIAGNOSIS — E119 Type 2 diabetes mellitus without complications: Secondary | ICD-10-CM | POA: Diagnosis not present

## 2019-03-09 HISTORY — DX: Encounter for immunization: Z23

## 2019-03-09 HISTORY — DX: Frequency of micturition: R35.0

## 2019-03-24 DIAGNOSIS — M545 Low back pain: Secondary | ICD-10-CM | POA: Diagnosis not present

## 2019-03-24 DIAGNOSIS — E1142 Type 2 diabetes mellitus with diabetic polyneuropathy: Secondary | ICD-10-CM | POA: Diagnosis not present

## 2019-03-24 DIAGNOSIS — Z79899 Other long term (current) drug therapy: Secondary | ICD-10-CM | POA: Diagnosis not present

## 2019-03-24 DIAGNOSIS — G8929 Other chronic pain: Secondary | ICD-10-CM | POA: Diagnosis not present

## 2019-04-16 DIAGNOSIS — E119 Type 2 diabetes mellitus without complications: Secondary | ICD-10-CM | POA: Insufficient documentation

## 2019-04-16 DIAGNOSIS — E109 Type 1 diabetes mellitus without complications: Secondary | ICD-10-CM | POA: Insufficient documentation

## 2019-04-16 DIAGNOSIS — I10 Essential (primary) hypertension: Secondary | ICD-10-CM | POA: Diagnosis not present

## 2019-04-16 HISTORY — DX: Type 1 diabetes mellitus without complications: E10.9

## 2019-04-21 DIAGNOSIS — M47816 Spondylosis without myelopathy or radiculopathy, lumbar region: Secondary | ICD-10-CM | POA: Diagnosis not present

## 2019-04-21 DIAGNOSIS — E1142 Type 2 diabetes mellitus with diabetic polyneuropathy: Secondary | ICD-10-CM | POA: Diagnosis not present

## 2019-04-21 DIAGNOSIS — M545 Low back pain: Secondary | ICD-10-CM | POA: Diagnosis not present

## 2019-04-21 DIAGNOSIS — Z79899 Other long term (current) drug therapy: Secondary | ICD-10-CM | POA: Diagnosis not present

## 2019-04-21 DIAGNOSIS — G8929 Other chronic pain: Secondary | ICD-10-CM | POA: Diagnosis not present

## 2019-04-27 DIAGNOSIS — I739 Peripheral vascular disease, unspecified: Secondary | ICD-10-CM | POA: Diagnosis not present

## 2019-04-27 DIAGNOSIS — I10 Essential (primary) hypertension: Secondary | ICD-10-CM | POA: Diagnosis not present

## 2019-04-27 DIAGNOSIS — E119 Type 2 diabetes mellitus without complications: Secondary | ICD-10-CM | POA: Diagnosis not present

## 2019-04-27 DIAGNOSIS — E785 Hyperlipidemia, unspecified: Secondary | ICD-10-CM | POA: Diagnosis not present

## 2019-05-14 DIAGNOSIS — E785 Hyperlipidemia, unspecified: Secondary | ICD-10-CM | POA: Diagnosis not present

## 2019-05-14 DIAGNOSIS — Z6841 Body Mass Index (BMI) 40.0 and over, adult: Secondary | ICD-10-CM | POA: Insufficient documentation

## 2019-05-14 DIAGNOSIS — I10 Essential (primary) hypertension: Secondary | ICD-10-CM | POA: Diagnosis not present

## 2019-05-14 DIAGNOSIS — E109 Type 1 diabetes mellitus without complications: Secondary | ICD-10-CM | POA: Diagnosis not present

## 2019-05-14 DIAGNOSIS — M6530 Trigger finger, unspecified finger: Secondary | ICD-10-CM

## 2019-05-14 HISTORY — DX: Trigger finger, unspecified finger: M65.30

## 2019-05-14 HISTORY — DX: Body Mass Index (BMI) 40.0 and over, adult: Z684

## 2019-05-22 DIAGNOSIS — G8929 Other chronic pain: Secondary | ICD-10-CM | POA: Diagnosis not present

## 2019-05-22 DIAGNOSIS — M545 Low back pain: Secondary | ICD-10-CM | POA: Diagnosis not present

## 2019-05-22 DIAGNOSIS — Z79899 Other long term (current) drug therapy: Secondary | ICD-10-CM | POA: Diagnosis not present

## 2019-08-10 DIAGNOSIS — J019 Acute sinusitis, unspecified: Secondary | ICD-10-CM | POA: Insufficient documentation

## 2019-08-10 DIAGNOSIS — R059 Cough, unspecified: Secondary | ICD-10-CM | POA: Insufficient documentation

## 2019-08-10 DIAGNOSIS — Z20822 Contact with and (suspected) exposure to covid-19: Secondary | ICD-10-CM | POA: Insufficient documentation

## 2019-08-10 HISTORY — DX: Cough, unspecified: R05.9

## 2019-08-10 HISTORY — DX: Contact with and (suspected) exposure to covid-19: Z20.822

## 2019-08-10 HISTORY — DX: Acute sinusitis, unspecified: J01.90

## 2019-08-12 DIAGNOSIS — J9601 Acute respiratory failure with hypoxia: Secondary | ICD-10-CM

## 2019-08-12 DIAGNOSIS — E119 Type 2 diabetes mellitus without complications: Secondary | ICD-10-CM

## 2019-08-12 DIAGNOSIS — R7989 Other specified abnormal findings of blood chemistry: Secondary | ICD-10-CM | POA: Diagnosis not present

## 2019-08-12 DIAGNOSIS — J441 Chronic obstructive pulmonary disease with (acute) exacerbation: Secondary | ICD-10-CM

## 2019-08-12 DIAGNOSIS — I2510 Atherosclerotic heart disease of native coronary artery without angina pectoris: Secondary | ICD-10-CM

## 2019-08-13 DIAGNOSIS — J9601 Acute respiratory failure with hypoxia: Secondary | ICD-10-CM | POA: Diagnosis not present

## 2019-08-13 DIAGNOSIS — J441 Chronic obstructive pulmonary disease with (acute) exacerbation: Secondary | ICD-10-CM | POA: Diagnosis not present

## 2019-08-13 DIAGNOSIS — R7989 Other specified abnormal findings of blood chemistry: Secondary | ICD-10-CM | POA: Diagnosis not present

## 2019-08-13 DIAGNOSIS — I2510 Atherosclerotic heart disease of native coronary artery without angina pectoris: Secondary | ICD-10-CM | POA: Diagnosis not present

## 2019-08-14 DIAGNOSIS — R7989 Other specified abnormal findings of blood chemistry: Secondary | ICD-10-CM | POA: Diagnosis not present

## 2019-08-14 DIAGNOSIS — I2510 Atherosclerotic heart disease of native coronary artery without angina pectoris: Secondary | ICD-10-CM | POA: Diagnosis not present

## 2019-08-14 DIAGNOSIS — J9601 Acute respiratory failure with hypoxia: Secondary | ICD-10-CM | POA: Diagnosis not present

## 2019-08-14 DIAGNOSIS — J441 Chronic obstructive pulmonary disease with (acute) exacerbation: Secondary | ICD-10-CM | POA: Diagnosis not present

## 2019-08-15 DIAGNOSIS — J9601 Acute respiratory failure with hypoxia: Secondary | ICD-10-CM | POA: Diagnosis not present

## 2019-08-15 DIAGNOSIS — I517 Cardiomegaly: Secondary | ICD-10-CM | POA: Diagnosis not present

## 2019-08-15 DIAGNOSIS — R7989 Other specified abnormal findings of blood chemistry: Secondary | ICD-10-CM | POA: Diagnosis not present

## 2019-08-15 DIAGNOSIS — J441 Chronic obstructive pulmonary disease with (acute) exacerbation: Secondary | ICD-10-CM | POA: Diagnosis not present

## 2019-08-15 DIAGNOSIS — I2510 Atherosclerotic heart disease of native coronary artery without angina pectoris: Secondary | ICD-10-CM | POA: Diagnosis not present

## 2019-08-16 DIAGNOSIS — J9601 Acute respiratory failure with hypoxia: Secondary | ICD-10-CM | POA: Diagnosis not present

## 2019-08-16 DIAGNOSIS — I2510 Atherosclerotic heart disease of native coronary artery without angina pectoris: Secondary | ICD-10-CM | POA: Diagnosis not present

## 2019-08-16 DIAGNOSIS — J441 Chronic obstructive pulmonary disease with (acute) exacerbation: Secondary | ICD-10-CM | POA: Diagnosis not present

## 2019-08-16 DIAGNOSIS — R7989 Other specified abnormal findings of blood chemistry: Secondary | ICD-10-CM | POA: Diagnosis not present

## 2019-09-05 DIAGNOSIS — J189 Pneumonia, unspecified organism: Secondary | ICD-10-CM | POA: Insufficient documentation

## 2019-09-05 HISTORY — DX: Pneumonia, unspecified organism: J18.9

## 2019-09-19 DIAGNOSIS — R300 Dysuria: Secondary | ICD-10-CM

## 2019-09-19 HISTORY — DX: Dysuria: R30.0

## 2019-12-25 DIAGNOSIS — F419 Anxiety disorder, unspecified: Secondary | ICD-10-CM

## 2019-12-25 HISTORY — DX: Anxiety disorder, unspecified: F41.9

## 2020-02-22 DIAGNOSIS — R252 Cramp and spasm: Secondary | ICD-10-CM

## 2020-02-22 HISTORY — DX: Cramp and spasm: R25.2

## 2020-04-08 ENCOUNTER — Other Ambulatory Visit: Payer: Self-pay | Admitting: Cardiology

## 2020-05-06 ENCOUNTER — Encounter: Payer: Self-pay | Admitting: Cardiology

## 2020-05-06 ENCOUNTER — Ambulatory Visit (INDEPENDENT_AMBULATORY_CARE_PROVIDER_SITE_OTHER): Payer: Medicare Other | Admitting: Cardiology

## 2020-05-06 ENCOUNTER — Other Ambulatory Visit: Payer: Self-pay

## 2020-05-06 VITALS — BP 150/80 | HR 100 | Ht 66.0 in | Wt 267.0 lb

## 2020-05-06 DIAGNOSIS — E785 Hyperlipidemia, unspecified: Secondary | ICD-10-CM

## 2020-05-06 DIAGNOSIS — I10 Essential (primary) hypertension: Secondary | ICD-10-CM

## 2020-05-06 DIAGNOSIS — E119 Type 2 diabetes mellitus without complications: Secondary | ICD-10-CM | POA: Diagnosis not present

## 2020-05-06 DIAGNOSIS — I739 Peripheral vascular disease, unspecified: Secondary | ICD-10-CM

## 2020-05-06 DIAGNOSIS — I252 Old myocardial infarction: Secondary | ICD-10-CM

## 2020-05-06 DIAGNOSIS — I2510 Atherosclerotic heart disease of native coronary artery without angina pectoris: Secondary | ICD-10-CM | POA: Insufficient documentation

## 2020-05-06 DIAGNOSIS — R079 Chest pain, unspecified: Secondary | ICD-10-CM | POA: Insufficient documentation

## 2020-05-06 HISTORY — DX: Old myocardial infarction: I25.2

## 2020-05-06 HISTORY — DX: Chest pain, unspecified: R07.9

## 2020-05-06 NOTE — Patient Instructions (Signed)
Medication Instructions:  Your physician recommends that you continue on your current medications as directed. Please refer to the Current Medication list given to you today.  *If you need a refill on your cardiac medications before your next appointment, please call your pharmacy*   Lab Work: None If you have labs (blood work) drawn today and your tests are completely normal, you will receive your results only by: Marland Kitchen MyChart Message (if you have MyChart) OR . A paper copy in the mail If you have any lab test that is abnormal or we need to change your treatment, we will call you to review the results.   Testing/Procedures: Your physician has requested that you have an echocardiogram. Echocardiography is a painless test that uses sound waves to create images of your heart. It provides your doctor with information about the size and shape of your heart and how well your heart's chambers and valves are working. This procedure takes approximately one hour. There are no restrictions for this procedure.  Your physician has requested that you have a carotid duplex. This test is an ultrasound of the carotid arteries in your neck. It looks at blood flow through these arteries that supply the brain with blood. Allow one hour for this exam. There are no restrictions or special instructions.  Your physician has requested that you have a lower extremity arterial exercise duplex. During this test, exercise and ultrasound are used to evaluate arterial blood flow in the legs. Allow one hour for this exam. There are no restrictions or special instructions.    Follow-Up: At [facility], you and your health needs are our priority.  As part of our continuing mission to provide you with exceptional heart care, we have created designated Provider Care Teams.  These Care Teams include your primary Cardiologist (physician) and Advanced Practice Providers (APPs -  Physician Assistants and Nurse Practitioners) who  all work together to provide you with the care you need, when you need it.  We recommend signing up for the patient portal called "MyChart".  Sign up information is provided on this After Visit Summary.  MyChart is used to connect with patients for Virtual Visits (Telemedicine).  Patients are able to view lab/test results, encounter notes, upcoming appointments, etc.  Non-urgent messages can be sent to your provider as well.   To learn more about what you can do with MyChart, go to [email or web address].    Your next appointment:   6 week(s)  The format for your next appointment:   In Person  Provider:   Jenne Campus, MD   Other Instructions

## 2020-05-06 NOTE — Progress Notes (Signed)
Cardiology Consultation:    Date:  05/06/2020   ID:  Eddie Jones, DOB [birth year], MRN [id number]  PCP:  Christ Kick, MD  Cardiologist:  Jenne Campus, MD   Referring MD: Christ Kick, *   No chief complaint on file. I would like to be reestablished in the practice  History of Present Illness:    Eddie Jones is a 64 y.o. male who is being seen today for the evaluation of coronary artery disease/peripheral vascular disease at the request of Achreja, Dickey Gave, *.  I took care of him many years ago close to 9 years ago at that time he had already past medical history significant for coronary artery disease status post myocardial infarction, peripheral vascular disease status post stenting carotic's.  He was referred back to [place] because of some symptomatology.  He complained of having leg pain he said when he walks he will have a lot of pain in his calf he had to stop pain goes away denies have any chest pain tightness squeezing pressure burning chest but shortness of breath is there.  He still continues to smoke.  Denies have any TIA or CVA symptoms.  Tried to distract his home.  His wife actually passed, also he lost his daughter, his daughter was on dialysis for years and eventually she passed.  He said he was depressed to the point that he wanted to kill himself however he recovered from this and now he want to be reestablished in the practice and want to do good for his health.  He does not exercise on the regular basis trying to eat better his sugar typically is about 1 30-1 4 he tells me.  Past Medical History:  Diagnosis Date  . Acute sinusitis, unspecified 08/10/2019  . Alcohol screening 02/09/2019  . Anxiety disorder 12/25/2019  . Benign essential hypertension 12/25/2018  . Body mass index 38.0-38.9, adult 02/09/2019  . Body mass index 39.0-39.9, adult 12/25/2018  . Body mass index 40.0-44.9, adult ([place]) 05/14/2019  . Cough 08/10/2019  . Depression  screening 02/09/2019  . Diabetes mellitus type 1, controlled, without complications ([place]) 14/43/1540  . Diabetes mellitus type 2, controlled, without complications ([place]) 08/67/6195  . Dyslipidemia 12/25/2018  . Dysuria 09/19/2019  . Exposure to COVID-19 virus 08/10/2019  . General medical examination 02/09/2019  . Malaise and fatigue 12/25/2018  . Mini stroke ([place])   . Muscle cramps 02/22/2020  . Myocardial infarct ([place])   . Need for pneumococcal vaccination 01/29/2019  . Need for prophylactic vaccination and inoculation against influenza 03/09/2019  . Peripheral vascular disease ([place]) 01/31/2019  . Pneumonia, organism unspecified(486) 09/05/2019  . Screening for osteoporosis 01/29/2019  . Trigger finger, unspecified finger 05/14/2019  . Urinary frequency 03/09/2019    Past Surgical History:  Procedure Laterality Date  . CAROTID STENT    . CATARACT EXTRACTION Bilateral   . CORONARY ANGIOPLASTY WITH STENT PLACEMENT     7 or 8 stents    Current Medications: Current Meds  Medication Sig  . aspirin EC 81 MG tablet Take 81 mg by mouth daily. Swallow whole.  Marland Kitchen buPROPion (WELLBUTRIN SR) 150 MG 12 hr tablet Take 150 mg by mouth every morning.  . celecoxib (CELEBREX) 400 MG capsule Take 400 mg by mouth daily.  . clopidogrel (PLAVIX) 75 MG tablet Take 1 tablet by mouth daily.  . furosemide (LASIX) 20 MG tablet Take 20 mg by mouth daily.  Marland Kitchen glimepiride (AMARYL) 4 MG tablet Take 4 mg  by mouth 2 (two) times daily.  Marland Kitchen lisinopril (ZESTRIL) 10 MG tablet Take 10 mg by mouth daily.  . metFORMIN (GLUCOPHAGE) 500 MG tablet Take 500 mg by mouth 2 (two) times daily.  . nicotine (NICODERM CQ - DOSED IN MG/24 HOURS) 21 mg/24hr patch 21 mg every morning.  . nitroGLYCERIN (NITROSTAT) 0.4 MG SL tablet PLACE 1 TABLET UNDER TONGUE AS NEEDED FOR CHEST PAIN. MAY REPEAT EVERY 5 MIN X 3. CALL 911 IF NO RELIEF AFTER 1ST.  Marland Kitchen Oxycodone HCl 20 MG TABS SMARTSIG:1 Tablet(s) By Mouth 4-5 Times Daily  .  pioglitazone (ACTOS) 30 MG tablet Take 30 mg by mouth daily.  . pravastatin (PRAVACHOL) 20 MG tablet Take 20 mg by mouth daily.  Marland Kitchen rOPINIRole (REQUIP) 0.5 MG tablet Take 0.5 mg by mouth 2 (two) times daily.  . [DISCONTINUED] cyclobenzaprine (FLEXERIL) 10 MG tablet Take by mouth.  . [DISCONTINUED] diazepam (VALIUM) 5 MG tablet Take by mouth.  . [DISCONTINUED] lisinopril (ZESTRIL) 20 MG tablet Take 1 tablet by mouth daily.  . [DISCONTINUED] metoprolol tartrate (LOPRESSOR) 50 MG tablet TAKE 2 TABLET EVERY MORNING AND 1 TABLET EVERY EVENING  . [DISCONTINUED] tiZANidine (ZANAFLEX) 4 MG tablet Take 4 mg by mouth.     Allergies:   Morphine and Oxymorphone   Social History   Socioeconomic History  . Marital status: Married    Spouse name: Not on file  . Number of children: Not on file  . Years of education: Not on file  . Highest education level: Not on file  Occupational History  . Not on file  Tobacco Use  . Smoking status: Current Every Day Smoker    Packs/day: 1.00    Types: Cigarettes  . Smokeless tobacco: Never Used  Substance and Sexual Activity  . Alcohol use: Yes    Comment: rarely  . Drug use: Never  . Sexual activity: Not on file  Other Topics Concern  . Not on file  Social History Narrative  . Not on file   Social Determinants of Health   Financial Resource Strain:   . Difficulty of Paying Living Expenses: Not on file  Food Insecurity:   . Worried About Charity fundraiser in the Last Year: Not on file  . Ran Out of Food in the Last Year: Not on file  Transportation Needs:   . Lack of Transportation (Medical): Not on file  . Lack of Transportation (Non-Medical): Not on file  Physical Activity:   . Days of Exercise per Week: Not on file  . Minutes of Exercise per Session: Not on file  Stress:   . Feeling of Stress : Not on file  Social Connections:   . Frequency of Communication with Friends and Family: Not on file  . Frequency of Social Gatherings with  Friends and Family: Not on file  . Attends Religious Services: Not on file  . Active Member of Clubs or Organizations: Not on file  . Attends Archivist Meetings: Not on file  . Marital Status: Not on file     Family History: The patient's family history includes Cancer in his sister; Diabetes in his brother, brother, brother, and mother; Heart disease in his brother, brother, brother, brother, and mother. ROS:   Please see the history of present illness.    All 14 point review of systems negative except as described per history of present illness.  EKGs/Labs/Other Studies Reviewed:    The following studies were reviewed today:   EKG:  EKG is  ordered today.  The ekg ordered today demonstrates EKG showed normal sinus rhythm rate 100 normal P interval normal QS complex duration morphology, no ST segment changes.  Recent Labs: No results found for requested labs within last 8760 hours.  Recent Lipid Panel No results found for: CHOL, TRIG, HDL, CHOLHDL, VLDL, LDLCALC, LDLDIRECT  Physical Exam:    VS:  BP (!) 150/80 (BP Location: Left Arm, Patient Position: Sitting, Cuff Size: Normal)   Pulse 100   Ht 5\' 6"  (1.676 m)   Wt 267 lb (121.1 kg)   SpO2 95%   BMI 43.09 kg/m     Wt Readings from Last 3 Encounters:  05/06/20 267 lb (121.1 kg)  02/22/20 261 lb 3.2 oz (118.5 kg)     GEN:  Well nourished, well developed in no acute distress HEENT: Normal NECK: No JVD; No carotid bruits LYMPHATICS: No lymphadenopathy CARDIAC: RRR, no murmurs, no rubs, no gallops RESPIRATORY:  Clear to auscultation without rales, wheezing or rhonchi  ABDOMEN: Soft, non-tender, non-distended MUSCULOSKELETAL:  No edema; No deformity  SKIN: Warm and dry NEUROLOGIC:  Alert and oriented x 3 PSYCHIATRIC:  Normal affect   ASSESSMENT:    1. Peripheral vascular disease ([place])   2. Essential hypertension   3. Old myocardial infarction   4. Controlled type 2 diabetes mellitus without  complication, without long-term current use of insulin ([place])   5. Dyslipidemia    PLAN:    In order of problems listed above:  1. Peripheral vascular disease.  He does have history of carotic arterial disease, ongoing physical examination he resolved.  The right side, I will schedule him to have carotic ultrasound.  He is already on dual antiplatelet therapy which I will continue for now. 2. Essential hypertension: His blood pressure today is slightly elevated 150/90.  We will do some lab work as before committing him  to some extra medications. 3. Coronary artery disease, status post old myocardial infarction.  Echocardiogram will be done to reassess his left ventricular ejection fraction.  We will continue antiplatelets therapy and he is on dual antiplatelets therapy, will continue statin. 4. Diabetes mellitus to be followed by primary care physician I will call the office to get his hemoglobin A1c. 5. Dyslipidemia: He is on a low intensity statin.  With his problems he need to be at least moderate of favorably high intensity statin.  We will call primary care physician to get his fasting lipid profile.  I did review K PN from last year summer which showed LDL of 138 HDL 37 that clearly not sufficient. 6. Claudications I will schedule him to have arterial duplex of lower extremities to rule out significant stenosis there.   Medication Adjustments/Labs and Tests Ordered: Current medicines are reviewed at length with the patient today.  Concerns regarding medicines are outlined above.  No orders of the defined types were placed in this encounter.  No orders of the defined types were placed in this encounter.   Signed, Park Liter, MD, [facility]. 05/06/2020 4:07 PM     [facility]

## 2020-05-15 ENCOUNTER — Encounter: Payer: Self-pay | Admitting: Emergency Medicine

## 2020-05-28 ENCOUNTER — Other Ambulatory Visit: Payer: Medicare Other

## 2020-05-30 ENCOUNTER — Ambulatory Visit (INDEPENDENT_AMBULATORY_CARE_PROVIDER_SITE_OTHER): Payer: Medicare Other

## 2020-05-30 ENCOUNTER — Other Ambulatory Visit: Payer: Self-pay

## 2020-05-30 DIAGNOSIS — I739 Peripheral vascular disease, unspecified: Secondary | ICD-10-CM

## 2020-05-30 DIAGNOSIS — E785 Hyperlipidemia, unspecified: Secondary | ICD-10-CM

## 2020-05-30 DIAGNOSIS — I10 Essential (primary) hypertension: Secondary | ICD-10-CM

## 2020-05-30 DIAGNOSIS — I252 Old myocardial infarction: Secondary | ICD-10-CM

## 2020-05-30 DIAGNOSIS — E119 Type 2 diabetes mellitus without complications: Secondary | ICD-10-CM

## 2020-05-30 DIAGNOSIS — I6521 Occlusion and stenosis of right carotid artery: Secondary | ICD-10-CM

## 2020-05-30 DIAGNOSIS — R0600 Dyspnea, unspecified: Secondary | ICD-10-CM

## 2020-05-30 DIAGNOSIS — Z9862 Peripheral vascular angioplasty status: Secondary | ICD-10-CM

## 2020-05-30 LAB — ECHOCARDIOGRAM COMPLETE
Area-P 1/2: 3.91 cm2
S' Lateral: 4.5 cm
Single Plane A4C EF: 55.1 %

## 2020-05-30 NOTE — Progress Notes (Addendum)
Carotid duplex exam performed  Jimmy Jayshaun Phillips RDCS, RVT 

## 2020-05-30 NOTE — Progress Notes (Signed)
Bilateral lower extremity arterial duplex exam performed.  Jimmy Hargis Vandyne RDCS, RVT 

## 2020-05-30 NOTE — Progress Notes (Signed)
Complete echocardiogram performed.  Jimmy Mads Borgmeyer RDCS, RVT  

## 2020-06-11 ENCOUNTER — Other Ambulatory Visit: Payer: Self-pay

## 2020-06-12 ENCOUNTER — Other Ambulatory Visit: Payer: Self-pay

## 2020-06-12 ENCOUNTER — Ambulatory Visit (INDEPENDENT_AMBULATORY_CARE_PROVIDER_SITE_OTHER): Payer: Medicare Other | Admitting: Cardiology

## 2020-06-12 ENCOUNTER — Encounter: Payer: Self-pay | Admitting: Cardiology

## 2020-06-12 VITALS — BP 140/66 | HR 95 | Ht 66.0 in | Wt 206.0 lb

## 2020-06-12 DIAGNOSIS — I10 Essential (primary) hypertension: Secondary | ICD-10-CM

## 2020-06-12 DIAGNOSIS — I6523 Occlusion and stenosis of bilateral carotid arteries: Secondary | ICD-10-CM | POA: Diagnosis not present

## 2020-06-12 DIAGNOSIS — E119 Type 2 diabetes mellitus without complications: Secondary | ICD-10-CM

## 2020-06-12 DIAGNOSIS — I739 Peripheral vascular disease, unspecified: Secondary | ICD-10-CM

## 2020-06-12 DIAGNOSIS — I252 Old myocardial infarction: Secondary | ICD-10-CM | POA: Diagnosis not present

## 2020-06-12 MED ORDER — ROSUVASTATIN CALCIUM 10 MG PO TABS: 10.0000 mg | ORAL_TABLET | Freq: Every day | ORAL | 1 refills | Status: DC

## 2020-06-12 NOTE — Progress Notes (Signed)
Cardiology Office Note:    Date:  06/12/2020   ID:  Eddie Jones, DOB 30-Mar-1956, MRN [id number]  PCP:  Christ Kick, MD  Cardiologist:  Jenne Campus, MD    Referring MD: Christ Kick, *   No chief complaint on file. Still have a lot of pain in my back in my legs  History of Present Illness:    Eddie Jones is a 64 y.o. male with past medical history significant for coronary artery disease.  Years ago he did have myocardial infarction likely recent echocardiogram that was done in our office showed normal left ventricle ejection fraction normal.  Also does have significant peripheral vascular disease in form of carotic arterial disease which is noncritical as well as disease in lower extremities.  He disappeared from for my follow-up for many years and now he showed up and he would like to continue care with me.  Tragedy striking his family his wife passed also his daughter passed so there was a time that he was depressed and was really suicidal even however he likely recovered from this and now doing quite well.  Denies have any chest pain tightness squeezing pressure burning chest does have some shortness of breath and fatigue.  There is no swelling of lower extremities, biggest problem appears to be back pain.  Past Medical History:  Diagnosis Date   Acute sinusitis, unspecified 08/10/2019   Alcohol screening 02/09/2019   Anxiety disorder 12/25/2019   Benign essential hypertension 12/25/2018   Bilateral carotid artery stenosis 03/16/2016   Body mass index 38.0-38.9, adult 02/09/2019   Body mass index 39.0-39.9, adult 12/25/2018   Body mass index 40.0-44.9, adult ([place]) 05/14/2019   CAD (coronary artery disease) 11/20/2015   Carotid stenosis 11/20/2015   Chest pain, unspecified 05/06/2020   Chronic pain syndrome 11/20/2015   Cigarette smoker 09/04/2015   Cough 08/10/2019   Depression screening 02/09/2019   Diabetes ([place]) 11/20/2015   Diabetes  mellitus type 1, controlled, without complications ([place]) 09/73/5329   Diabetes mellitus type 2, controlled, without complications ([place]) 92/42/6834   Dyslipidemia 12/25/2018   Dysuria 09/19/2019   Essential hypertension 09/04/2015   Exposure to COVID-19 virus 08/10/2019   General medical examination 02/09/2019   Herniated nucleus pulposus, L5-S1 11/20/2015   Formatting of this note might be different from the original. Bilateral S1 roots. Stenosis L4-5, bilateral L4/L5 roots   High risk medication use 11/20/2015   Hyperlipidemia 11/20/2015   Low back pain 11/20/2015   Lumbar canal stenosis 11/20/2015   Formatting of this note might be different from the original. 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   aspirin EC 81 MG tablet Take 81 mg by mouth daily. Swallow whole.   buPROPion (WELLBUTRIN SR) 150 MG 12 hr tablet Take 150 mg by mouth every morning.   celecoxib (CELEBREX) 400 MG capsule Take 400 mg by mouth daily.   clopidogrel (PLAVIX) 75 MG tablet Take 1 tablet by mouth daily.   cyclobenzaprine  (FLEXERIL) 10 MG tablet Take 10 mg by mouth daily.   furosemide (LASIX) 20 MG tablet Take 20 mg by mouth daily.   glimepiride (AMARYL) 4 MG tablet Take 4 mg by mouth 2 (two) times daily.   lisinopril (ZESTRIL) 10 MG tablet Take 10 mg by mouth daily.   metFORMIN (GLUCOPHAGE) 500 MG tablet Take 500 mg by mouth 2 (two) times daily.   metoprolol tartrate (LOPRESSOR) 50 MG tablet Take 50 mg by mouth in the morning and at bedtime.   mirtazapine (REMERON) 15 MG tablet Take 15 mg by mouth at bedtime.   nicotine (NICODERM CQ - DOSED IN MG/24 HOURS) 21 mg/24hr patch 21 mg every morning.   nitroGLYCERIN (NITROSTAT) 0.4 MG SL tablet PLACE 1 TABLET UNDER TONGUE AS NEEDED FOR CHEST PAIN. MAY REPEAT EVERY 5 MIN X 3. CALL 911 IF NO RELIEF AFTER 1ST.   Oxycodone HCl 20 MG TABS SMARTSIG:1 Tablet(s) By Mouth 4-5 Times Daily   pioglitazone (ACTOS) 30 MG tablet Take 30 mg by mouth daily.   potassium chloride (KLOR-CON) 10 MEQ tablet Take 10 mEq by mouth daily.   pravastatin (PRAVACHOL) 20 MG tablet Take 20 mg by mouth daily.   pregabalin (LYRICA) 25 MG capsule Take 25 mg by mouth daily.   pregabalin (LYRICA) 50 MG capsule Take 50 mg by mouth in the morning, at noon, and at bedtime.   rOPINIRole (REQUIP) 0.5 MG tablet Take 0.5 mg by mouth 2 (two) times daily.     Allergies:   Morphine and Oxymorphone   Social History   Socioeconomic History   Marital status: Married    Spouse name: Not on file   Number of children: Not on file   Years of education: Not on file   Highest education level: Not on file  Occupational History   Not on file  Tobacco Use   Smoking status: Current Every Day Smoker    Packs/day: 1.00    Types: Cigarettes   Smokeless tobacco: Never Used  Substance and Sexual Activity   Alcohol use: Yes    Comment: rarely   Drug use: Never   Sexual activity: Not on file  Other Topics Concern   Not on file  Social History Narrative   Not on file   Social  Determinants of Health   Financial Resource Strain: Not on file  Food Insecurity: Not on file  Transportation Needs: Not on file  Physical Activity: Not on file  Stress: Not on file  Social Connections: Not on file     Family History: The patient's family history includes Cancer in his sister; Diabetes in his brother, brother, brother, and mother; Heart disease in his brother, brother, brother, brother, and mother. ROS:   Please see the history of present illness.    All 14 point review of systems negative except as described per history of present illness  EKGs/Labs/Other Studies Reviewed:    Carotic ultrasounds from 3 December showed: Right Carotid: Velocities in the right ICA are consistent with a 40-59% stenosis. The ECA appears >50% stenosed. Left Carotid: Normal flow hemodynamics s/p ICA stent placement. Vertebrals:  Bilateral vertebral arteries demonstrate antegrade flow. Subclavians: Normal flow hemodynamics were seen in bilateral subclavian arteries.  Recent Labs: No results found for requested labs within last 8760 hours.  Recent Lipid Panel No results found for: CHOL, TRIG, HDL, CHOLHDL, VLDL, LDLCALC, LDLDIRECT  Physical Exam:    VS:  BP 140/66 (BP Location: Right Arm, Patient Position: Sitting)    Pulse 95    Ht 5\' 6"  (1.676 m)    Wt 206 lb (93.4 kg)    SpO2 93%    BMI 33.25 kg/m     Wt Readings from Last 3 Encounters:  06/12/20 206 lb (93.4 kg)  05/06/20 267 lb (121.1 kg)  02/22/20 261 lb 3.2 oz (118.5 kg)     GEN:  Well nourished, well developed in no acute distress HEENT: Normal NECK: No JVD; No carotid bruits LYMPHATICS: No lymphadenopathy CARDIAC: RRR, no murmurs, no rubs, no gallops RESPIRATORY:  Clear to auscultation without rales, wheezing or rhonchi  ABDOMEN: Soft, non-tender, non-distended MUSCULOSKELETAL:  No edema; No deformity  SKIN: Warm and dry LOWER EXTREMITIES: no swelling NEUROLOGIC:  Alert and oriented x 3 PSYCHIATRIC:  Normal affect    ASSESSMENT:    1. Old myocardial infarction   2. Essential hypertension   3. Bilateral carotid artery stenosis   4. Benign essential hypertension   5. Controlled type 2 diabetes mellitus without complication, without long-term current use of insulin (HCC)   6. Claudication (HCC)    PLAN:    In order of problems listed above:  1. Old myocardial infarction however echocardiogram showed preserved left ventricle ejection fraction, he is on dual antiplatelet therapy which I will continue. 2. Essential hypertension blood pressure seems to be well controlled continue present management. 3. Bilateral carotic arterial stenosis, carotic ultrasound showed up to 60% stenosis on the right side.  We will continue risk factors modification which include dual antiplatelets therapy.  I will also augment his statin he is taking small dose of pravastatin I will switch this to Crestor 10 mg daily which is moderate intensity statin.  I want him about potential side effects. 4. Diabetes to be followed by primary care physician I do have data from 2019 showing hemoglobin A1c 7.3, no recent information available. 5. Leg pain suspicion for claudication however arterial duplex of lower valuation of lower extremity did not show any critical lesions there is some suggestion of potentially inflow disease, therefore I will do ultrasounds of his aorta with iliac vessels.   Medication Adjustments/Labs and Tests Ordered: Current medicines are reviewed at length with the patient today.  Concerns regarding medicines are outlined above.  No orders of the defined types were placed in this encounter.  Medication changes: No orders of the defined types were placed in this encounter.   Signed, Park Liter, MD, [facility] 06/12/2020 3:48 PM    [place]

## 2020-06-12 NOTE — Addendum Note (Signed)
Addended by: Senaida Ores on: 06/12/2020 03:56 PM   Modules accepted: Orders

## 2020-06-12 NOTE — Patient Instructions (Signed)
Medication Instructions:  Your physician has recommended you make the following change in your medication:   STOP: Pravastatin   START: Crestor 10 mg daily   *If you need a refill on your cardiac medications before your next appointment, please call your pharmacy*   Lab Work: Your physician recommends that you return for lab work in 6 weeks: lipid, lft  If you have labs (blood work) drawn today and your tests are completely normal, you will receive your results only by:  [place] (if you have MyChart) OR  A paper copy in the mail If you have any lab test that is abnormal or we need to change your treatment, we will call you to review the results.   Testing/Procedures: Your physician has requested that you have an abdominal aorta duplex. During this test, an ultrasound is used to evaluate the aorta. Allow 30 minutes for this exam. Do not eat after midnight the day before and avoid carbonated beverages   Follow-Up: At [facility], you and your health needs are our priority.  As part of our continuing mission to provide you with exceptional heart care, we have created designated Provider Care Teams.  These Care Teams include your primary Cardiologist (physician) and Advanced Practice Providers (APPs -  Physician Assistants and Nurse Practitioners) who all work together to provide you with the care you need, when you need it.  We recommend signing up for the patient portal called "MyChart".  Sign up information is provided on this After Visit Summary.  MyChart is used to connect with patients for Virtual Visits (Telemedicine).  Patients are able to view lab/test results, encounter notes, upcoming appointments, etc.  Non-urgent messages can be sent to your provider as well.   To learn more about what you can do with MyChart, go to [email or web address].    Your next appointment:   3 month(s)  The format for your next appointment:   In Person  Provider:   Jenne Campus,  MD   Other Instructions  Rosuvastatin Tablets What is this medicine? ROSUVASTATIN (roe SOO va sta tin) is known as a HMG-CoA reductase inhibitor or 'statin'. It lowers cholesterol and triglycerides in the blood. This drug may also reduce the risk of heart attack, stroke, or other health problems in patients with risk factors for heart disease. Diet and lifestyle changes are often used with this drug. This medicine may be used for other purposes; ask your health care provider or pharmacist if you have questions. COMMON BRAND NAME(S): Crestor What should I tell my health care provider before I take this medicine? They need to know if you have any of these conditions:  diabetes  if you often drink alcohol  history of stroke  kidney disease  liver disease  muscle aches or weakness  thyroid disease  an unusual or allergic reaction to rosuvastatin, other medicines, foods, dyes, or preservatives  pregnant or trying to get pregnant  breast-feeding How should I use this medicine? Take this medicine by mouth with a glass of water. Follow the directions on the prescription label. Do not cut, crush or chew this medicine. You can take this medicine with or without food. Take your doses at regular intervals. Do not take your medicine more often than directed. Talk to your pediatrician regarding the use of this medicine in children. While this drug may be prescribed for children as young as 71 years old for selected conditions, precautions do apply. Overdosage: If you think you have taken too much  of this medicine contact a poison control center or emergency room at once. NOTE: This medicine is only for you. Do not share this medicine with others. What if I miss a dose? If you miss a dose, take it as soon as you can. If your next dose is to be taken in less than 12 hours, then do not take the missed dose. Take the next dose at your regular time. Do not take double or extra doses. What may  interact with this medicine? Do not take this medicine with any of the following medications:  herbal medicines like red yeast rice This medicine may also interact with the following medications:  alcohol  antacids containing aluminum hydroxide or magnesium hydroxide  cyclosporine  other medicines for high cholesterol  some medicines for HIV infection  warfarin This list may not describe all possible interactions. Give your health care provider a list of all the medicines, herbs, non-prescription drugs, or dietary supplements you use. Also tell them if you smoke, drink alcohol, or use illegal drugs. Some items may interact with your medicine. What should I watch for while using this medicine? Visit your doctor or health care professional for regular check-ups. You may need regular tests to make sure your liver is working properly. Your health care professional may tell you to stop taking this medicine if you develop muscle problems. If your muscle problems do not go away after stopping this medicine, contact your health care professional. Do not become pregnant while taking this medicine. Women should inform their health care professional if they wish to become pregnant or think they might be pregnant. There is a potential for serious side effects to an unborn child. Talk to your health care professional or pharmacist for more information. Do not breast-feed an infant while taking this medicine. This medicine may increase blood sugar. Ask your healthcare provider if changes in diet or medicines are needed if you have diabetes. If you are going to need surgery or other procedure, tell your doctor that you are using this medicine. This drug is only part of a total heart-health program. Your doctor or a dietician can suggest a low-cholesterol and low-fat diet to help. Avoid alcohol and smoking, and keep a proper exercise schedule. This medicine may cause a decrease in Co-Enzyme Q-10. You should  make sure that you get enough Co-Enzyme Q-10 while you are taking this medicine. Discuss the foods you eat and the vitamins you take with your health care professional. What side effects may I notice from receiving this medicine? Side effects that you should report to your doctor or health care professional as soon as possible:  allergic reactions like skin rash, itching or hives, swelling of the face, lips, or tongue  confusion  joint pain  loss of memory  redness, blistering, peeling or loosening of the skin, including inside the mouth  signs and symptoms of high blood sugar such as being more thirsty or hungry or having to urinate more than normal. You may also feel very tired or have blurry vision.  signs and symptoms of muscle injury like dark urine; trouble passing urine or change in the amount of urine; unusually weak or tired; muscle pain or side or back pain  yellowing of the eyes or skin Side effects that usually do not require medical attention (report to your doctor or health care professional if they continue or are bothersome):  constipation  diarrhea  dizziness  gas  headache  nausea  stomach pain  trouble sleeping  upset stomach This list may not describe all possible side effects. Call your doctor for medical advice about side effects. You may report side effects to FDA at 1-800-FDA-1088. Where should I keep my medicine? Keep out of the reach of children. Store at room temperature between 20 and 25 degrees C (68 and 77 degrees F). Keep container tightly closed (protect from moisture). Throw away any unused medicine after the expiration date. NOTE: This sheet is a summary. It may not cover all possible information. If you have questions about this medicine, talk to your doctor, pharmacist, or health care provider.  2020 [employer] Standard (2018-04-06 08:25:08)

## 2020-07-21 ENCOUNTER — Other Ambulatory Visit: Payer: Self-pay

## 2020-07-21 ENCOUNTER — Ambulatory Visit (INDEPENDENT_AMBULATORY_CARE_PROVIDER_SITE_OTHER): Payer: Medicare Other

## 2020-07-21 DIAGNOSIS — I739 Peripheral vascular disease, unspecified: Secondary | ICD-10-CM | POA: Diagnosis not present

## 2020-07-21 DIAGNOSIS — I6523 Occlusion and stenosis of bilateral carotid arteries: Secondary | ICD-10-CM

## 2020-07-21 DIAGNOSIS — I10 Essential (primary) hypertension: Secondary | ICD-10-CM

## 2020-07-21 DIAGNOSIS — E119 Type 2 diabetes mellitus without complications: Secondary | ICD-10-CM

## 2020-07-21 DIAGNOSIS — I252 Old myocardial infarction: Secondary | ICD-10-CM

## 2020-07-21 NOTE — Progress Notes (Signed)
Abdominal aortic duplex exam performed.Poor image quality obtained.   Jimmy Tej Murdaugh RDCS, RVT

## 2020-08-20 ENCOUNTER — Encounter (HOSPITAL_COMMUNITY): Payer: Self-pay | Admitting: Emergency Medicine

## 2020-08-20 ENCOUNTER — Inpatient Hospital Stay (HOSPITAL_COMMUNITY)
Admission: EM | Admit: 2020-08-20 | Discharge: 2020-08-24 | DRG: 246 | Disposition: A | Discharge status: Home / Self Care | Payer: Medicare Other | Source: Ambulatory Visit | Attending: Family Medicine | Admitting: Family Medicine

## 2020-08-20 ENCOUNTER — Emergency Department (HOSPITAL_COMMUNITY): Payer: Medicare Other

## 2020-08-20 DIAGNOSIS — Z20822 Contact with and (suspected) exposure to covid-19: Secondary | ICD-10-CM | POA: Diagnosis present

## 2020-08-20 DIAGNOSIS — R7989 Other specified abnormal findings of blood chemistry: Secondary | ICD-10-CM

## 2020-08-20 DIAGNOSIS — I110 Hypertensive heart disease with heart failure: Secondary | ICD-10-CM | POA: Diagnosis not present

## 2020-08-20 DIAGNOSIS — F419 Anxiety disorder, unspecified: Secondary | ICD-10-CM | POA: Diagnosis present

## 2020-08-20 DIAGNOSIS — I5043 Acute on chronic combined systolic (congestive) and diastolic (congestive) heart failure: Secondary | ICD-10-CM | POA: Diagnosis present

## 2020-08-20 DIAGNOSIS — Z87891 Personal history of nicotine dependence: Secondary | ICD-10-CM

## 2020-08-20 DIAGNOSIS — E119 Type 2 diabetes mellitus without complications: Secondary | ICD-10-CM

## 2020-08-20 DIAGNOSIS — R0602 Shortness of breath: Secondary | ICD-10-CM

## 2020-08-20 DIAGNOSIS — R778 Other specified abnormalities of plasma proteins: Secondary | ICD-10-CM | POA: Diagnosis present

## 2020-08-20 DIAGNOSIS — E785 Hyperlipidemia, unspecified: Secondary | ICD-10-CM | POA: Diagnosis present

## 2020-08-20 DIAGNOSIS — I2510 Atherosclerotic heart disease of native coronary artery without angina pectoris: Secondary | ICD-10-CM | POA: Diagnosis present

## 2020-08-20 DIAGNOSIS — Z79899 Other long term (current) drug therapy: Secondary | ICD-10-CM

## 2020-08-20 DIAGNOSIS — G2581 Restless legs syndrome: Secondary | ICD-10-CM | POA: Diagnosis present

## 2020-08-20 DIAGNOSIS — I252 Old myocardial infarction: Secondary | ICD-10-CM

## 2020-08-20 DIAGNOSIS — Z95828 Presence of other vascular implants and grafts: Secondary | ICD-10-CM

## 2020-08-20 DIAGNOSIS — G894 Chronic pain syndrome: Secondary | ICD-10-CM | POA: Diagnosis present

## 2020-08-20 DIAGNOSIS — Z7982 Long term (current) use of aspirin: Secondary | ICD-10-CM

## 2020-08-20 DIAGNOSIS — I509 Heart failure, unspecified: Secondary | ICD-10-CM | POA: Diagnosis not present

## 2020-08-20 DIAGNOSIS — F32A Depression, unspecified: Secondary | ICD-10-CM | POA: Diagnosis present

## 2020-08-20 DIAGNOSIS — Z7984 Long term (current) use of oral hypoglycemic drugs: Secondary | ICD-10-CM

## 2020-08-20 DIAGNOSIS — I10 Essential (primary) hypertension: Secondary | ICD-10-CM | POA: Diagnosis present

## 2020-08-20 DIAGNOSIS — Z8249 Family history of ischemic heart disease and other diseases of the circulatory system: Secondary | ICD-10-CM

## 2020-08-20 DIAGNOSIS — Z6841 Body Mass Index (BMI) 40.0 and over, adult: Secondary | ICD-10-CM

## 2020-08-20 DIAGNOSIS — R0902 Hypoxemia: Secondary | ICD-10-CM | POA: Diagnosis present

## 2020-08-20 DIAGNOSIS — Z833 Family history of diabetes mellitus: Secondary | ICD-10-CM

## 2020-08-20 DIAGNOSIS — Z955 Presence of coronary angioplasty implant and graft: Secondary | ICD-10-CM

## 2020-08-20 DIAGNOSIS — I69322 Dysarthria following cerebral infarction: Secondary | ICD-10-CM

## 2020-08-20 HISTORY — DX: Other specified abnormalities of plasma proteins: R77.8

## 2020-08-20 HISTORY — DX: Other specified abnormal findings of blood chemistry: R79.89

## 2020-08-20 LAB — TROPONIN I (HIGH SENSITIVITY)
Troponin I (High Sensitivity): 314 ng/L (ref <18)
Troponin I (High Sensitivity): 321 ng/L (ref <18)
Troponin I (High Sensitivity): 323 ng/L (ref <18)

## 2020-08-20 LAB — URINALYSIS, ROUTINE W REFLEX MICROSCOPIC
Bilirubin Urine: NEGATIVE
Glucose, UA: NEGATIVE mg/dL
Hgb urine dipstick: NEGATIVE
Ketones, ur: NEGATIVE mg/dL
Leukocytes,Ua: NEGATIVE
Nitrite: NEGATIVE
Protein, ur: NEGATIVE mg/dL
Specific Gravity, Urine: 1.004 — ABNORMAL LOW (ref 1.005–1.030)
pH: 8 (ref 5.0–8.0)

## 2020-08-20 LAB — CBC
HCT: 46.1 % (ref 39.0–52.0)
Hemoglobin: 14.1 g/dL (ref 13.0–17.0)
MCH: 29.1 pg (ref 26.0–34.0)
MCHC: 30.6 g/dL (ref 30.0–36.0)
MCV: 95.1 fL (ref 80.0–100.0)
Platelets: 259 10*3/uL (ref 150–400)
RBC: 4.85 MIL/uL (ref 4.22–5.81)
RDW: 15.7 % — ABNORMAL HIGH (ref 11.5–15.5)
WBC: 9.9 10*3/uL (ref 4.0–10.5)
nRBC: 0 % (ref 0.0–0.2)

## 2020-08-20 LAB — COMPREHENSIVE METABOLIC PANEL
ALT: 68 U/L — ABNORMAL HIGH (ref 0–44)
AST: 51 U/L — ABNORMAL HIGH (ref 15–41)
Albumin: 3.5 g/dL (ref 3.5–5.0)
Alkaline Phosphatase: 51 U/L (ref 38–126)
Anion gap: 12 (ref 5–15)
BUN: 15 mg/dL (ref 8–23)
CO2: 29 mmol/L (ref 22–32)
Calcium: 8.8 mg/dL — ABNORMAL LOW (ref 8.9–10.3)
Chloride: 95 mmol/L — ABNORMAL LOW (ref 98–111)
Creatinine, Ser: 1.13 mg/dL (ref 0.61–1.24)
GFR, Estimated: >60 mL/min (ref >60)
Glucose, Bld: 119 mg/dL — ABNORMAL HIGH (ref 70–99)
Potassium: 5.1 mmol/L (ref 3.5–5.1)
Sodium: 136 mmol/L (ref 135–145)
Total Bilirubin: 0.9 mg/dL (ref 0.3–1.2)
Total Protein: 7.5 g/dL (ref 6.5–8.1)

## 2020-08-20 LAB — RESP PANEL BY RT-PCR (FLU A&B, COVID) ARPGX2
Influenza A by PCR: NEGATIVE
Influenza B by PCR: NEGATIVE
SARS Coronavirus 2 by RT PCR: NEGATIVE

## 2020-08-20 LAB — BRAIN NATRIURETIC PEPTIDE: B Natriuretic Peptide: 140.9 pg/mL — ABNORMAL HIGH (ref 0.0–100.0)

## 2020-08-20 LAB — LIPASE, BLOOD: Lipase: 25 U/L (ref 11–51)

## 2020-08-20 MED ORDER — OXYCODONE HCL ER 10 MG PO T12A
20.0000 mg | EXTENDED_RELEASE_TABLET | Freq: Two times a day (BID) | ORAL | Status: DC
Start: 2020-08-20 — End: 2020-08-24
  Administered 2020-08-20 – 2020-08-24 (×8): 20 mg via ORAL
  Filled 2020-08-20 (×2): qty 2
  Filled 2020-08-20: qty 1
  Filled 2020-08-20 (×3): qty 2
  Filled 2020-08-20: qty 1
  Filled 2020-08-20 (×2): qty 2

## 2020-08-20 MED ORDER — NICOTINE 21 MG/24HR TD PT24
21.0000 mg | MEDICATED_PATCH | Freq: Once | TRANSDERMAL | Status: AC
  Administered 2020-08-20: 21 mg via TRANSDERMAL
  Filled 2020-08-20: qty 1

## 2020-08-20 MED ORDER — ALBUTEROL SULFATE HFA 108 (90 BASE) MCG/ACT IN AERS
2.0000 | INHALATION_SPRAY | Freq: Once | RESPIRATORY_TRACT | Status: AC
  Administered 2020-08-20: 2 via RESPIRATORY_TRACT
  Filled 2020-08-20: qty 6.7

## 2020-08-20 MED ORDER — FUROSEMIDE 10 MG/ML IJ SOLN
40.0000 mg | Freq: Once | INTRAMUSCULAR | Status: AC
  Administered 2020-08-20: 40 mg via INTRAVENOUS
  Filled 2020-08-20: qty 4

## 2020-08-20 MED ORDER — METHYLPREDNISOLONE SODIUM SUCC 125 MG IJ SOLR
125.0000 mg | Freq: Once | INTRAMUSCULAR | Status: AC
  Administered 2020-08-20: 125 mg via INTRAVENOUS
  Filled 2020-08-20: qty 2

## 2020-08-20 MED ORDER — ASPIRIN 325 MG PO TABS
325.0000 mg | ORAL_TABLET | Freq: Once | ORAL | Status: AC
  Administered 2020-08-20: 325 mg via ORAL
  Filled 2020-08-20: qty 1

## 2020-08-20 NOTE — ED Notes (Addendum)
Pt sleeping O2 sat noted to drop into the mid/high 80s, rose slowly when awoken, pt placed on 2L [place].
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
## 2020-08-20 NOTE — ED Notes (Addendum)
Pt provided meal

## 2020-08-20 NOTE — ED Notes (Signed)
Pulse oximetry 95% while ambulating
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
## 2020-08-21 ENCOUNTER — Inpatient Hospital Stay (HOSPITAL_COMMUNITY): Payer: Medicare Other

## 2020-08-21 ENCOUNTER — Encounter (HOSPITAL_COMMUNITY): Payer: Self-pay | Admitting: Internal Medicine

## 2020-08-21 DIAGNOSIS — Z79899 Other long term (current) drug therapy: Secondary | ICD-10-CM | POA: Diagnosis not present

## 2020-08-21 DIAGNOSIS — Z6841 Body Mass Index (BMI) 40.0 and over, adult: Secondary | ICD-10-CM | POA: Diagnosis not present

## 2020-08-21 DIAGNOSIS — G2581 Restless legs syndrome: Secondary | ICD-10-CM | POA: Diagnosis present

## 2020-08-21 DIAGNOSIS — G894 Chronic pain syndrome: Secondary | ICD-10-CM | POA: Diagnosis present

## 2020-08-21 DIAGNOSIS — Z20822 Contact with and (suspected) exposure to covid-19: Secondary | ICD-10-CM | POA: Diagnosis present

## 2020-08-21 DIAGNOSIS — F32A Depression, unspecified: Secondary | ICD-10-CM | POA: Diagnosis present

## 2020-08-21 DIAGNOSIS — Z833 Family history of diabetes mellitus: Secondary | ICD-10-CM | POA: Diagnosis not present

## 2020-08-21 DIAGNOSIS — Z7982 Long term (current) use of aspirin: Secondary | ICD-10-CM | POA: Diagnosis not present

## 2020-08-21 DIAGNOSIS — Z87891 Personal history of nicotine dependence: Secondary | ICD-10-CM | POA: Diagnosis not present

## 2020-08-21 DIAGNOSIS — Z8249 Family history of ischemic heart disease and other diseases of the circulatory system: Secondary | ICD-10-CM | POA: Diagnosis not present

## 2020-08-21 DIAGNOSIS — I5021 Acute systolic (congestive) heart failure: Secondary | ICD-10-CM | POA: Diagnosis not present

## 2020-08-21 DIAGNOSIS — E785 Hyperlipidemia, unspecified: Secondary | ICD-10-CM | POA: Diagnosis present

## 2020-08-21 DIAGNOSIS — I5031 Acute diastolic (congestive) heart failure: Secondary | ICD-10-CM

## 2020-08-21 DIAGNOSIS — I110 Hypertensive heart disease with heart failure: Secondary | ICD-10-CM | POA: Diagnosis present

## 2020-08-21 DIAGNOSIS — Z955 Presence of coronary angioplasty implant and graft: Secondary | ICD-10-CM | POA: Diagnosis not present

## 2020-08-21 DIAGNOSIS — R778 Other specified abnormalities of plasma proteins: Secondary | ICD-10-CM | POA: Diagnosis present

## 2020-08-21 DIAGNOSIS — I252 Old myocardial infarction: Secondary | ICD-10-CM | POA: Diagnosis not present

## 2020-08-21 DIAGNOSIS — E119 Type 2 diabetes mellitus without complications: Secondary | ICD-10-CM | POA: Diagnosis present

## 2020-08-21 DIAGNOSIS — I10 Essential (primary) hypertension: Secondary | ICD-10-CM | POA: Diagnosis not present

## 2020-08-21 DIAGNOSIS — I25118 Atherosclerotic heart disease of native coronary artery with other forms of angina pectoris: Secondary | ICD-10-CM | POA: Diagnosis not present

## 2020-08-21 DIAGNOSIS — I509 Heart failure, unspecified: Secondary | ICD-10-CM

## 2020-08-21 DIAGNOSIS — I5043 Acute on chronic combined systolic (congestive) and diastolic (congestive) heart failure: Secondary | ICD-10-CM | POA: Diagnosis present

## 2020-08-21 DIAGNOSIS — F419 Anxiety disorder, unspecified: Secondary | ICD-10-CM | POA: Diagnosis present

## 2020-08-21 DIAGNOSIS — I25119 Atherosclerotic heart disease of native coronary artery with unspecified angina pectoris: Secondary | ICD-10-CM | POA: Diagnosis not present

## 2020-08-21 DIAGNOSIS — Z7984 Long term (current) use of oral hypoglycemic drugs: Secondary | ICD-10-CM | POA: Diagnosis not present

## 2020-08-21 DIAGNOSIS — R0902 Hypoxemia: Secondary | ICD-10-CM | POA: Diagnosis present

## 2020-08-21 DIAGNOSIS — I69322 Dysarthria following cerebral infarction: Secondary | ICD-10-CM | POA: Diagnosis not present

## 2020-08-21 DIAGNOSIS — I5023 Acute on chronic systolic (congestive) heart failure: Secondary | ICD-10-CM | POA: Diagnosis not present

## 2020-08-21 DIAGNOSIS — Z95828 Presence of other vascular implants and grafts: Secondary | ICD-10-CM | POA: Diagnosis not present

## 2020-08-21 DIAGNOSIS — I214 Non-ST elevation (NSTEMI) myocardial infarction: Secondary | ICD-10-CM | POA: Diagnosis not present

## 2020-08-21 DIAGNOSIS — I2510 Atherosclerotic heart disease of native coronary artery without angina pectoris: Secondary | ICD-10-CM | POA: Diagnosis present

## 2020-08-21 HISTORY — DX: Heart failure, unspecified: I50.9

## 2020-08-21 LAB — CBC
HCT: 47.1 % (ref 39.0–52.0)
Hemoglobin: 14.5 g/dL (ref 13.0–17.0)
MCH: 29.4 pg (ref 26.0–34.0)
MCHC: 30.8 g/dL (ref 30.0–36.0)
MCV: 95.3 fL (ref 80.0–100.0)
Platelets: 278 10*3/uL (ref 150–400)
RBC: 4.94 MIL/uL (ref 4.22–5.81)
RDW: 15.5 % (ref 11.5–15.5)
WBC: 5.7 10*3/uL (ref 4.0–10.5)
nRBC: 0 % (ref 0.0–0.2)

## 2020-08-21 LAB — CBG MONITORING, ED
Glucose-Capillary: 199 mg/dL — ABNORMAL HIGH (ref 70–99)
Glucose-Capillary: 152 mg/dL — ABNORMAL HIGH (ref 70–99)

## 2020-08-21 LAB — GLUCOSE, CAPILLARY
Glucose-Capillary: 67 mg/dL — ABNORMAL LOW (ref 70–99)
Glucose-Capillary: 106 mg/dL — ABNORMAL HIGH (ref 70–99)

## 2020-08-21 LAB — CREATININE, SERUM
Creatinine, Ser: 1.31 mg/dL — ABNORMAL HIGH (ref 0.61–1.24)
GFR, Estimated: >60 mL/min (ref >60)

## 2020-08-21 LAB — ECHOCARDIOGRAM COMPLETE
Area-P 1/2: 3.12 cm2
S' Lateral: 4.7 cm
Weight: 4331.6 oz

## 2020-08-21 LAB — HIV ANTIBODY (ROUTINE TESTING W REFLEX): HIV Screen 4th Generation wRfx: NONREACTIVE

## 2020-08-21 LAB — TSH: TSH: 1.445 u[IU]/mL (ref 0.350–4.500)

## 2020-08-21 LAB — MAGNESIUM: Magnesium: 2 mg/dL (ref 1.7–2.4)

## 2020-08-21 MED ORDER — SENNOSIDES-DOCUSATE SODIUM 8.6-50 MG PO TABS
2.0000 | ORAL_TABLET | Freq: Two times a day (BID) | ORAL | Status: DC
  Administered 2020-08-21 – 2020-08-24 (×6): 2 via ORAL
  Filled 2020-08-21 (×6): qty 2

## 2020-08-21 MED ORDER — ACETAMINOPHEN 325 MG PO TABS: 650.0000 mg | ORAL_TABLET | Freq: Four times a day (QID) | ORAL | Status: DC | PRN

## 2020-08-21 MED ORDER — ALBUTEROL SULFATE HFA 108 (90 BASE) MCG/ACT IN AERS
2.0000 | INHALATION_SPRAY | RESPIRATORY_TRACT | Status: DC | PRN
  Filled 2020-08-21: qty 6.7

## 2020-08-21 MED ORDER — PERFLUTREN LIPID MICROSPHERE
1.0000 mL | INTRAVENOUS | Status: AC | PRN
  Administered 2020-08-21: 1 mL via INTRAVENOUS
  Filled 2020-08-21: qty 10

## 2020-08-21 MED ORDER — NALOXONE HCL 0.4 MG/ML IJ SOLN: 0.4000 mg | INTRAMUSCULAR | Status: DC | PRN

## 2020-08-21 MED ORDER — LISINOPRIL 10 MG PO TABS
10.0000 mg | ORAL_TABLET | Freq: Every day | ORAL | Status: DC
  Administered 2020-08-21 – 2020-08-22 (×2): 10 mg via ORAL
  Filled 2020-08-21 (×2): qty 1

## 2020-08-21 MED ORDER — METOPROLOL TARTRATE 50 MG PO TABS
50.0000 mg | ORAL_TABLET | Freq: Two times a day (BID) | ORAL | Status: DC
  Administered 2020-08-21 – 2020-08-22 (×3): 50 mg via ORAL
  Filled 2020-08-21: qty 2
  Filled 2020-08-21 (×2): qty 1

## 2020-08-21 MED ORDER — CLOPIDOGREL BISULFATE 75 MG PO TABS
75.0000 mg | ORAL_TABLET | Freq: Every day | ORAL | Status: DC
  Administered 2020-08-21 – 2020-08-24 (×4): 75 mg via ORAL
  Filled 2020-08-21 (×4): qty 1

## 2020-08-21 MED ORDER — ROSUVASTATIN CALCIUM 5 MG PO TABS: 10.0000 mg | ORAL_TABLET | Freq: Every day | ORAL | Status: DC

## 2020-08-21 MED ORDER — FUROSEMIDE 10 MG/ML IJ SOLN
40.0000 mg | Freq: Every day | INTRAMUSCULAR | Status: DC
  Administered 2020-08-21 – 2020-08-23 (×3): 40 mg via INTRAVENOUS
  Filled 2020-08-21 (×3): qty 4

## 2020-08-21 MED ORDER — BUPROPION HCL ER (SR) 150 MG PO TB12
150.0000 mg | ORAL_TABLET | Freq: Every morning | ORAL | Status: DC
  Administered 2020-08-22 – 2020-08-24 (×3): 150 mg via ORAL
  Filled 2020-08-21 (×3): qty 1

## 2020-08-21 MED ORDER — ACETAMINOPHEN 650 MG RE SUPP
650.0000 mg | Freq: Four times a day (QID) | RECTAL | Status: DC | PRN
Start: 2020-08-21 — End: 2020-08-22

## 2020-08-21 MED ORDER — ENOXAPARIN SODIUM 40 MG/0.4ML ~~LOC~~ SOLN
40.0000 mg | SUBCUTANEOUS | Status: DC
  Administered 2020-08-21 – 2020-08-24 (×4): 40 mg via SUBCUTANEOUS
  Filled 2020-08-21 (×4): qty 0.4

## 2020-08-21 MED ORDER — POTASSIUM CHLORIDE CRYS ER 10 MEQ PO TBCR
10.0000 meq | EXTENDED_RELEASE_TABLET | Freq: Every day | ORAL | Status: DC
  Administered 2020-08-21: 10 meq via ORAL
  Filled 2020-08-21: qty 1

## 2020-08-21 MED ORDER — ROPINIROLE HCL 0.5 MG PO TABS
0.5000 mg | ORAL_TABLET | Freq: Two times a day (BID) | ORAL | Status: DC
  Administered 2020-08-21 – 2020-08-24 (×6): 0.5 mg via ORAL
  Filled 2020-08-21 (×7): qty 1

## 2020-08-21 MED ORDER — OXYCODONE HCL 5 MG PO TABS
10.0000 mg | ORAL_TABLET | Freq: Four times a day (QID) | ORAL | Status: DC | PRN
  Administered 2020-08-22 – 2020-08-24 (×7): 10 mg via ORAL
  Filled 2020-08-21 (×7): qty 2

## 2020-08-21 MED ORDER — MIRTAZAPINE 15 MG PO TABS
15.0000 mg | ORAL_TABLET | Freq: Every day | ORAL | Status: DC
  Administered 2020-08-21 – 2020-08-23 (×4): 15 mg via ORAL
  Filled 2020-08-21 (×5): qty 1

## 2020-08-21 MED ORDER — ASPIRIN EC 81 MG PO TBEC
81.0000 mg | DELAYED_RELEASE_TABLET | Freq: Every day | ORAL | Status: DC
  Administered 2020-08-21 – 2020-08-24 (×4): 81 mg via ORAL
  Filled 2020-08-21 (×4): qty 1

## 2020-08-21 MED ORDER — ROSUVASTATIN CALCIUM 20 MG PO TABS
20.0000 mg | ORAL_TABLET | Freq: Every day | ORAL | Status: DC
  Administered 2020-08-21 – 2020-08-22 (×2): 20 mg via ORAL
  Filled 2020-08-21 (×2): qty 1

## 2020-08-21 MED ORDER — NITROGLYCERIN 0.4 MG SL SUBL: 0.4000 mg | SUBLINGUAL_TABLET | SUBLINGUAL | Status: DC | PRN

## 2020-08-21 MED ORDER — INSULIN ASPART 100 UNIT/ML ~~LOC~~ SOLN
0.0000 [IU] | Freq: Three times a day (TID) | SUBCUTANEOUS | Status: DC
  Administered 2020-08-21 – 2020-08-24 (×6): 2 [IU] via SUBCUTANEOUS

## 2020-08-21 MED ORDER — PREGABALIN 25 MG PO CAPS
50.0000 mg | ORAL_CAPSULE | Freq: Every day | ORAL | Status: DC
  Administered 2020-08-21 – 2020-08-24 (×4): 50 mg via ORAL
  Filled 2020-08-21 (×4): qty 2

## 2020-08-21 NOTE — ED Notes (Signed)
Tele Breakfast order placed 

## 2020-08-21 NOTE — ED Notes (Signed)
Medications given. Pt repositioned.
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
## 2020-08-21 NOTE — Progress Notes (Signed)
*  PRELIMINARY RESULTS* Echocardiogram 2D Echocardiogram has been performed.  Leavy Cella 08/21/2020, 4:13 PM

## 2020-08-21 NOTE — ED Notes (Signed)
Patient is resting comfortably at this time. No needs identified.
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
## 2020-08-21 NOTE — ED Notes (Signed)
Patient is resting comfortably  at this time. No needs identified.

## 2020-08-21 NOTE — ED Notes (Signed)
Pt offered a hospital bed. Pt does not want bed at this time. No needs identified.
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
## 2020-08-21 NOTE — ED Notes (Signed)
Patient eating breakfast at this time.

## 2020-08-22 ENCOUNTER — Encounter (HOSPITAL_COMMUNITY): Payer: Self-pay | Admitting: Internal Medicine

## 2020-08-22 ENCOUNTER — Encounter (HOSPITAL_COMMUNITY)
Admission: EM | Disposition: A | Discharge status: Home / Self Care | Payer: Self-pay | Source: Ambulatory Visit | Attending: Internal Medicine

## 2020-08-22 ENCOUNTER — Other Ambulatory Visit: Payer: Self-pay

## 2020-08-22 DIAGNOSIS — I25119 Atherosclerotic heart disease of native coronary artery with unspecified angina pectoris: Secondary | ICD-10-CM

## 2020-08-22 DIAGNOSIS — I5031 Acute diastolic (congestive) heart failure: Secondary | ICD-10-CM | POA: Diagnosis not present

## 2020-08-22 DIAGNOSIS — I25118 Atherosclerotic heart disease of native coronary artery with other forms of angina pectoris: Secondary | ICD-10-CM

## 2020-08-22 DIAGNOSIS — R0602 Shortness of breath: Secondary | ICD-10-CM

## 2020-08-22 HISTORY — PX: CORONARY STENT INTERVENTION: CATH118234

## 2020-08-22 HISTORY — PX: INTRAVASCULAR ULTRASOUND/IVUS: CATH118244

## 2020-08-22 HISTORY — PX: RIGHT/LEFT HEART CATH AND CORONARY ANGIOGRAPHY: CATH118266

## 2020-08-22 LAB — POCT I-STAT EG7
Acid-Base Excess: 10 mmol/L — ABNORMAL HIGH (ref 0.0–2.0)
Acid-Base Excess: 9 mmol/L — ABNORMAL HIGH (ref 0.0–2.0)
Bicarbonate: 37.6 mmol/L — ABNORMAL HIGH (ref 20.0–28.0)
Bicarbonate: 38.2 mmol/L — ABNORMAL HIGH (ref 20.0–28.0)
Calcium, Ion: 1.19 mmol/L (ref 1.15–1.40)
Calcium, Ion: 1.2 mmol/L (ref 1.15–1.40)
HCT: 47 % (ref 39.0–52.0)
HCT: 47 % (ref 39.0–52.0)
Hemoglobin: 16 g/dL (ref 13.0–17.0)
Hemoglobin: 16 g/dL (ref 13.0–17.0)
O2 Saturation: 67 %
O2 Saturation: 98 %
Potassium: 4.6 mmol/L (ref 3.5–5.1)
Potassium: 4.6 mmol/L (ref 3.5–5.1)
Sodium: 140 mmol/L (ref 135–145)
Sodium: 140 mmol/L (ref 135–145)
TCO2: 40 mmol/L — ABNORMAL HIGH (ref 22–32)
TCO2: 40 mmol/L — ABNORMAL HIGH (ref 22–32)
pCO2, Ven: 63.4 mmHg — ABNORMAL HIGH (ref 44.0–60.0)
pCO2, Ven: 66.3 mmHg — ABNORMAL HIGH (ref 44.0–60.0)
pH, Ven: 7.368 (ref 7.250–7.430)
pH, Ven: 7.381 (ref 7.250–7.430)
pO2, Ven: 104 mmHg — ABNORMAL HIGH (ref 32.0–45.0)
pO2, Ven: 38 mmHg (ref 32.0–45.0)

## 2020-08-22 LAB — BASIC METABOLIC PANEL
Anion gap: 9 (ref 5–15)
BUN: 26 mg/dL — ABNORMAL HIGH (ref 8–23)
CO2: 34 mmol/L — ABNORMAL HIGH (ref 22–32)
Calcium: 8.8 mg/dL — ABNORMAL LOW (ref 8.9–10.3)
Chloride: 97 mmol/L — ABNORMAL LOW (ref 98–111)
Creatinine, Ser: 1.33 mg/dL — ABNORMAL HIGH (ref 0.61–1.24)
GFR, Estimated: >60 mL/min (ref >60)
Glucose, Bld: 111 mg/dL — ABNORMAL HIGH (ref 70–99)
Potassium: 4.9 mmol/L (ref 3.5–5.1)
Sodium: 140 mmol/L (ref 135–145)

## 2020-08-22 LAB — LIPID PANEL
Cholesterol: 111 mg/dL (ref 0–200)
HDL: 32 mg/dL — ABNORMAL LOW (ref >40)
LDL Cholesterol: 55 mg/dL (ref 0–99)
Total CHOL/HDL Ratio: 3.5 ratio
Triglycerides: 121 mg/dL (ref <150)
VLDL: 24 mg/dL (ref 0–40)

## 2020-08-22 LAB — GLUCOSE, CAPILLARY
Glucose-Capillary: 107 mg/dL — ABNORMAL HIGH (ref 70–99)
Glucose-Capillary: 135 mg/dL — ABNORMAL HIGH (ref 70–99)
Glucose-Capillary: 102 mg/dL — ABNORMAL HIGH (ref 70–99)
Glucose-Capillary: 173 mg/dL — ABNORMAL HIGH (ref 70–99)

## 2020-08-22 LAB — HEMOGLOBIN A1C
Hgb A1c MFr Bld: 7.5 % — ABNORMAL HIGH (ref 4.8–5.6)
Mean Plasma Glucose: 168.55 mg/dL

## 2020-08-22 LAB — POCT ACTIVATED CLOTTING TIME
Activated Clotting Time: 273 s
Activated Clotting Time: 362 seconds

## 2020-08-22 SURGERY — RIGHT/LEFT HEART CATH AND CORONARY ANGIOGRAPHY
Anesthesia: LOCAL

## 2020-08-22 MED ORDER — VERAPAMIL HCL 2.5 MG/ML IV SOLN
INTRAVENOUS | Status: DC | PRN
  Administered 2020-08-22: 10 mL via INTRA_ARTERIAL

## 2020-08-22 MED ORDER — SODIUM CHLORIDE 0.9% FLUSH: 3.0000 mL | INTRAVENOUS | Status: DC | PRN

## 2020-08-22 MED ORDER — FAMOTIDINE 20 MG PO TABS
20.0000 mg | ORAL_TABLET | Freq: Every day | ORAL | Status: DC
  Administered 2020-08-22 – 2020-08-24 (×3): 20 mg via ORAL
  Filled 2020-08-22 (×3): qty 1

## 2020-08-22 MED ORDER — IOHEXOL 350 MG/ML SOLN
INTRAVENOUS | Status: DC | PRN
  Administered 2020-08-22 (×2): 130 mL

## 2020-08-22 MED ORDER — HEPARIN SODIUM (PORCINE) 1000 UNIT/ML IJ SOLN
INTRAMUSCULAR | Status: AC
  Filled 2020-08-22: qty 1

## 2020-08-22 MED ORDER — LIDOCAINE HCL (PF) 1 % IJ SOLN
INTRAMUSCULAR | Status: AC
  Filled 2020-08-22: qty 30

## 2020-08-22 MED ORDER — HEPARIN SODIUM (PORCINE) 1000 UNIT/ML IJ SOLN
INTRAMUSCULAR | Status: DC | PRN
  Administered 2020-08-22: 2000 [IU] via INTRAVENOUS
  Administered 2020-08-22: 7000 [IU] via INTRAVENOUS
  Administered 2020-08-22: 5000 [IU] via INTRAVENOUS

## 2020-08-22 MED ORDER — MIDAZOLAM HCL 2 MG/2ML IJ SOLN
INTRAMUSCULAR | Status: DC | PRN
  Administered 2020-08-22 (×2): 1 mg via INTRAVENOUS

## 2020-08-22 MED ORDER — SODIUM CHLORIDE 0.9 % IV SOLN
INTRAVENOUS | Status: AC
  Administered 2020-08-22: 75 mL/h via INTRAVENOUS

## 2020-08-22 MED ORDER — SODIUM CHLORIDE 0.9 % IV SOLN: INTRAVENOUS | Status: DC

## 2020-08-22 MED ORDER — HEPARIN (PORCINE) IN NACL 1000-0.9 UT/500ML-% IV SOLN
INTRAVENOUS | Status: DC | PRN
  Administered 2020-08-22 (×2): 500 mL

## 2020-08-22 MED ORDER — HYDRALAZINE HCL 20 MG/ML IJ SOLN: 10.0000 mg | INTRAMUSCULAR | Status: AC | PRN

## 2020-08-22 MED ORDER — LIDOCAINE HCL (PF) 1 % IJ SOLN
INTRAMUSCULAR | Status: DC | PRN
  Administered 2020-08-22: 5 mL

## 2020-08-22 MED ORDER — FENTANYL CITRATE (PF) 100 MCG/2ML IJ SOLN
INTRAMUSCULAR | Status: DC | PRN
  Administered 2020-08-22 (×2): 25 ug via INTRAVENOUS

## 2020-08-22 MED ORDER — HEPARIN (PORCINE) IN NACL 1000-0.9 UT/500ML-% IV SOLN
INTRAVENOUS | Status: AC
  Filled 2020-08-22: qty 1000

## 2020-08-22 MED ORDER — SODIUM CHLORIDE 0.9 % IV SOLN: 250.0000 mL | INTRAVENOUS | Status: DC | PRN

## 2020-08-22 MED ORDER — CLOPIDOGREL BISULFATE 300 MG PO TABS
ORAL_TABLET | ORAL | Status: DC | PRN
  Administered 2020-08-22: 600 mg via ORAL

## 2020-08-22 MED ORDER — ONDANSETRON HCL 4 MG/2ML IJ SOLN: 4.0000 mg | Freq: Four times a day (QID) | INTRAMUSCULAR | Status: DC | PRN

## 2020-08-22 MED ORDER — FENTANYL CITRATE (PF) 100 MCG/2ML IJ SOLN
INTRAMUSCULAR | Status: AC
  Filled 2020-08-22: qty 2

## 2020-08-22 MED ORDER — LOSARTAN POTASSIUM 25 MG PO TABS
25.0000 mg | ORAL_TABLET | Freq: Every day | ORAL | Status: DC
  Administered 2020-08-23 – 2020-08-24 (×2): 25 mg via ORAL
  Filled 2020-08-22 (×2): qty 1

## 2020-08-22 MED ORDER — VERAPAMIL HCL 2.5 MG/ML IV SOLN
INTRAVENOUS | Status: AC
  Filled 2020-08-22: qty 2

## 2020-08-22 MED ORDER — ROPINIROLE HCL 0.5 MG PO TABS
0.5000 mg | ORAL_TABLET | Freq: Once | ORAL | Status: AC
  Administered 2020-08-22: 0.5 mg via ORAL
  Filled 2020-08-22: qty 1

## 2020-08-22 MED ORDER — METOPROLOL SUCCINATE ER 50 MG PO TB24
50.0000 mg | ORAL_TABLET | Freq: Every day | ORAL | Status: DC
  Administered 2020-08-23 – 2020-08-24 (×2): 50 mg via ORAL
  Filled 2020-08-22 (×2): qty 1

## 2020-08-22 MED ORDER — NITROGLYCERIN 1 MG/10 ML FOR IR/CATH LAB
INTRA_ARTERIAL | Status: AC
  Filled 2020-08-22: qty 10

## 2020-08-22 MED ORDER — ROSUVASTATIN CALCIUM 20 MG PO TABS
40.0000 mg | ORAL_TABLET | Freq: Every day | ORAL | Status: DC
  Administered 2020-08-23 – 2020-08-24 (×2): 40 mg via ORAL
  Filled 2020-08-22 (×2): qty 2

## 2020-08-22 MED ORDER — MIDAZOLAM HCL 2 MG/2ML IJ SOLN
INTRAMUSCULAR | Status: AC
  Filled 2020-08-22: qty 2

## 2020-08-22 MED ORDER — SODIUM CHLORIDE 0.9% FLUSH
3.0000 mL | Freq: Two times a day (BID) | INTRAVENOUS | Status: DC
  Administered 2020-08-22: 3 mL via INTRAVENOUS

## 2020-08-22 MED ORDER — ALBUTEROL SULFATE 1.25 MG/3ML IN NEBU: 3.0000 mL | INHALATION_SOLUTION | Freq: Four times a day (QID) | RESPIRATORY_TRACT | Status: DC | PRN

## 2020-08-22 MED ORDER — ALBUTEROL SULFATE (2.5 MG/3ML) 0.083% IN NEBU: 2.5000 mg | INHALATION_SOLUTION | Freq: Four times a day (QID) | RESPIRATORY_TRACT | Status: DC | PRN

## 2020-08-22 MED ORDER — ASPIRIN 81 MG PO CHEW: 81.0000 mg | CHEWABLE_TABLET | Freq: Every day | ORAL | Status: DC

## 2020-08-22 MED ORDER — CLOPIDOGREL BISULFATE 300 MG PO TABS
ORAL_TABLET | ORAL | Status: AC
  Filled 2020-08-22: qty 2

## 2020-08-22 MED ORDER — ACETAMINOPHEN 325 MG PO TABS: 650.0000 mg | ORAL_TABLET | ORAL | Status: DC | PRN

## 2020-08-22 MED ORDER — CLOPIDOGREL BISULFATE 75 MG PO TABS: 75.0000 mg | ORAL_TABLET | Freq: Every day | ORAL | Status: DC

## 2020-08-22 MED ORDER — SODIUM CHLORIDE 0.9% FLUSH
3.0000 mL | Freq: Two times a day (BID) | INTRAVENOUS | Status: DC
Start: 2020-08-22 — End: 2020-08-24
  Administered 2020-08-23 (×2): 3 mL via INTRAVENOUS

## 2020-08-22 MED ORDER — NITROGLYCERIN 1 MG/10 ML FOR IR/CATH LAB
INTRA_ARTERIAL | Status: DC | PRN
  Administered 2020-08-22: 200 ug

## 2020-08-22 MED ORDER — LABETALOL HCL 5 MG/ML IV SOLN: 10.0000 mg | INTRAVENOUS | Status: AC | PRN

## 2020-08-22 SURGICAL SUPPLY — 24 items
BALLN SAPPHIRE 2.5X20 (BALLOONS) ×2
BALLN SAPPHIRE 3.0X20 (BALLOONS) ×2
BALLN SAPPHIRE ~~LOC~~ 4.0X15 (BALLOONS) ×1 IMPLANT
BALLOON SAPPHIRE 2.5X20 (BALLOONS) IMPLANT
BALLOON SAPPHIRE 3.0X20 (BALLOONS) IMPLANT
CATH 5FR JL3.5 JR4 ANG PIG MP (CATHETERS) ×1 IMPLANT
CATH BALLN WEDGE 5F 110CM (CATHETERS) ×1 IMPLANT
CATH LAUNCHER 6FR JR4 (CATHETERS) ×1 IMPLANT
CATH OPTICROSS HD (CATHETERS) ×1 IMPLANT
DEVICE RAD COMP TR BAND LRG (VASCULAR PRODUCTS) ×1 IMPLANT
GLIDESHEATH SLEND SS 6F .021 (SHEATH) ×1 IMPLANT
GUIDEWIRE INQWIRE 1.5J.035X260 (WIRE) IMPLANT
INQWIRE 1.5J .035X260CM (WIRE) ×2
KIT ENCORE 26 ADVANTAGE (KITS) ×1 IMPLANT
KIT HEART LEFT (KITS) ×2 IMPLANT
KIT HEMO VALVE WATCHDOG (MISCELLANEOUS) ×1 IMPLANT
PACK CARDIAC CATHETERIZATION (CUSTOM PROCEDURE TRAY) ×2 IMPLANT
SHEATH GLIDE SLENDER 4/5FR (SHEATH) ×1 IMPLANT
SLED PULL BACK IVUS (MISCELLANEOUS) ×1 IMPLANT
STENT RESOLUTE ONYX 3.5X30 (Permanent Stent) ×1 IMPLANT
TRANSDUCER W/STOPCOCK (MISCELLANEOUS) ×2 IMPLANT
TUBING CIL FLEX 10 FLL-RA (TUBING) ×2 IMPLANT
WIRE ASAHI PROWATER 180CM (WIRE) ×1 IMPLANT
WIRE COUGAR XT STRL 190CM (WIRE) ×1 IMPLANT
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
## 2020-08-22 NOTE — Plan of Care (Signed)
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
## 2020-08-22 NOTE — Interval H&P Note (Signed)
Cath Lab Visit (complete for each Cath Lab visit)  Clinical Evaluation Leading to the Procedure:   ACS: Yes.    Non-ACS:    Anginal Classification: CCS IV  Anti-ischemic medical therapy: Minimal Therapy (1 class of medications)  Non-Invasive Test Results: High-risk stress test findings: cardiac mortality >3%/year  Prior CABG: No previous CABG   Low EF by echo   History and Physical Interval Note:  08/22/2020 4:10 PM  Eddie Jones  has presented today for surgery, with the diagnosis of nstemi.  The various methods of treatment have been discussed with the patient and family. After consideration of risks, benefits and other options for treatment, the patient has consented to  Procedure(s): RIGHT/LEFT HEART CATH AND CORONARY ANGIOGRAPHY (N/A) as a surgical intervention.  The patient's history has been reviewed, patient examined, no change in status, stable for surgery.  I have reviewed the patient's chart and labs.  Questions were answered to the patient's satisfaction.     Larae Grooms
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
## 2020-08-23 DIAGNOSIS — Z955 Presence of coronary angioplasty implant and graft: Secondary | ICD-10-CM

## 2020-08-23 DIAGNOSIS — I214 Non-ST elevation (NSTEMI) myocardial infarction: Secondary | ICD-10-CM

## 2020-08-23 DIAGNOSIS — I10 Essential (primary) hypertension: Secondary | ICD-10-CM

## 2020-08-23 LAB — CBC
HCT: 45.1 % (ref 39.0–52.0)
Hemoglobin: 14.8 g/dL (ref 13.0–17.0)
MCH: 30.2 pg (ref 26.0–34.0)
MCHC: 32.8 g/dL (ref 30.0–36.0)
MCV: 92 fL (ref 80.0–100.0)
Platelets: 351 10*3/uL (ref 150–400)
RBC: 4.9 MIL/uL (ref 4.22–5.81)
RDW: 15.9 % — ABNORMAL HIGH (ref 11.5–15.5)
WBC: 12 10*3/uL — ABNORMAL HIGH (ref 4.0–10.5)
nRBC: 0 % (ref 0.0–0.2)

## 2020-08-23 LAB — GLUCOSE, CAPILLARY
Glucose-Capillary: 177 mg/dL — ABNORMAL HIGH (ref 70–99)
Glucose-Capillary: 177 mg/dL — ABNORMAL HIGH (ref 70–99)
Glucose-Capillary: 159 mg/dL — ABNORMAL HIGH (ref 70–99)
Glucose-Capillary: 180 mg/dL — ABNORMAL HIGH (ref 70–99)
Glucose-Capillary: 158 mg/dL — ABNORMAL HIGH (ref 70–99)

## 2020-08-23 LAB — BASIC METABOLIC PANEL
Anion gap: 11 (ref 5–15)
BUN: 28 mg/dL — ABNORMAL HIGH (ref 8–23)
CO2: 28 mmol/L (ref 22–32)
Calcium: 8.8 mg/dL — ABNORMAL LOW (ref 8.9–10.3)
Chloride: 98 mmol/L (ref 98–111)
Creatinine, Ser: 1.34 mg/dL — ABNORMAL HIGH (ref 0.61–1.24)
GFR, Estimated: 60 mL/min — ABNORMAL LOW (ref >60)
Glucose, Bld: 92 mg/dL (ref 70–99)
Potassium: 5 mmol/L (ref 3.5–5.1)
Sodium: 137 mmol/L (ref 135–145)

## 2020-08-23 NOTE — Progress Notes (Signed)
CARDIAC REHAB PHASE I   PRE:  Rate/Rhythm: 65 SR    BP: sitting 145/70    SaO2: 95 RA  MODE:  Ambulation: 250 ft   POST:  Rate/Rhythm: 90 SR    BP: sitting 161/81     SaO2: 98 RA  No c/o CP or SOB walking. Limited distance by back pain and need to urinate. BP elevated. Discussed stent, Plavix, restrictions, diet, smoking cessation, walking as tolerated, NTG and CRPII. Pt somewhat dismissive of education. Sts he has nicotine patches he can use. Will refer to [place]. Sts he will take his meds.  [place], ACSM 08/23/2020 10:28 AM
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
## 2020-08-24 DIAGNOSIS — I5021 Acute systolic (congestive) heart failure: Secondary | ICD-10-CM

## 2020-08-24 DIAGNOSIS — I5023 Acute on chronic systolic (congestive) heart failure: Secondary | ICD-10-CM

## 2020-08-24 DIAGNOSIS — I5043 Acute on chronic combined systolic (congestive) and diastolic (congestive) heart failure: Secondary | ICD-10-CM

## 2020-08-24 LAB — BASIC METABOLIC PANEL
Anion gap: 11 (ref 5–15)
BUN: 24 mg/dL — ABNORMAL HIGH (ref 8–23)
CO2: 29 mmol/L (ref 22–32)
Calcium: 9.2 mg/dL (ref 8.9–10.3)
Chloride: 98 mmol/L (ref 98–111)
Creatinine, Ser: 1.3 mg/dL — ABNORMAL HIGH (ref 0.61–1.24)
GFR, Estimated: >60 mL/min (ref >60)
Glucose, Bld: 129 mg/dL — ABNORMAL HIGH (ref 70–99)
Potassium: 4.3 mmol/L (ref 3.5–5.1)
Sodium: 138 mmol/L (ref 135–145)

## 2020-08-24 LAB — GLUCOSE, CAPILLARY
Glucose-Capillary: 168 mg/dL — ABNORMAL HIGH (ref 70–99)
Glucose-Capillary: 160 mg/dL — ABNORMAL HIGH (ref 70–99)

## 2020-08-24 MED ORDER — LOSARTAN POTASSIUM 25 MG PO TABS: 25.0000 mg | ORAL_TABLET | Freq: Every day | ORAL | 2 refills | Status: DC

## 2020-08-24 MED ORDER — FUROSEMIDE 40 MG PO TABS: 40.0000 mg | ORAL_TABLET | Freq: Every day | ORAL | 2 refills | Status: DC

## 2020-08-24 MED ORDER — FUROSEMIDE 40 MG PO TABS
40.0000 mg | ORAL_TABLET | Freq: Every day | ORAL | Status: DC
  Administered 2020-08-24: 40 mg via ORAL
  Filled 2020-08-24: qty 1

## 2020-08-24 MED ORDER — METOPROLOL SUCCINATE ER 50 MG PO TB24: 50.0000 mg | ORAL_TABLET | Freq: Every day | ORAL | 2 refills | Status: DC

## 2020-08-24 MED ORDER — CLOPIDOGREL BISULFATE 75 MG PO TABS: 75.0000 mg | ORAL_TABLET | Freq: Every day | ORAL | 3 refills | Status: DC

## 2020-08-24 MED ORDER — SPIRONOLACTONE 25 MG PO TABS: 25.0000 mg | ORAL_TABLET | Freq: Every day | ORAL | 2 refills | Status: DC

## 2020-08-24 MED ORDER — ROSUVASTATIN CALCIUM 40 MG PO TABS: 40.0000 mg | ORAL_TABLET | Freq: Every day | ORAL | 2 refills | Status: DC

## 2020-08-24 MED ORDER — SPIRONOLACTONE 25 MG PO TABS
25.0000 mg | ORAL_TABLET | Freq: Every day | ORAL | Status: DC
  Administered 2020-08-24: 25 mg via ORAL
  Filled 2020-08-24: qty 1
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
## 2020-08-25 ENCOUNTER — Telehealth: Payer: Self-pay

## 2020-08-25 ENCOUNTER — Encounter (HOSPITAL_COMMUNITY): Payer: Self-pay | Admitting: Interventional Cardiology

## 2020-08-25 NOTE — Telephone Encounter (Signed)
Called and spoke with patient about his hospital follow up. Advised the patient that he already has an appointment scheduled for 09-11-20 at 4:00 pm and asked him if he would like to come in sooner, he states this appointment date works fine.

## 2020-08-25 NOTE — Telephone Encounter (Signed)
-----   Message from Darreld Mclean, [place] sent at 08/24/2020 10:29 AM EST ----- Regarding: Hospital Follow-up Patient is going to be discharge today following NSTEMI and PCI. Per Dr. Audie Box, he needs a follow-up visit with Dr. Agustin Cree in 1-2 weeks. Can you please call patient and help schedule this?  Thank you! Callie

## 2020-08-29 ENCOUNTER — Telehealth (HOSPITAL_COMMUNITY): Payer: Self-pay

## 2020-08-29 NOTE — Telephone Encounter (Signed)
Per phase I, fax cardiac rehab referral to [place] [facility].  

## 2020-09-09 DIAGNOSIS — G459 Transient cerebral ischemic attack, unspecified: Secondary | ICD-10-CM | POA: Insufficient documentation

## 2020-09-09 DIAGNOSIS — I219 Acute myocardial infarction, unspecified: Secondary | ICD-10-CM | POA: Insufficient documentation

## 2020-09-09 DIAGNOSIS — I639 Cerebral infarction, unspecified: Secondary | ICD-10-CM | POA: Insufficient documentation

## 2020-09-11 ENCOUNTER — Encounter: Payer: Self-pay | Admitting: Cardiology

## 2020-09-11 ENCOUNTER — Ambulatory Visit (INDEPENDENT_AMBULATORY_CARE_PROVIDER_SITE_OTHER): Payer: Medicare Other | Admitting: Cardiology

## 2020-09-11 ENCOUNTER — Other Ambulatory Visit: Payer: Self-pay

## 2020-09-11 VITALS — BP 128/76 | HR 66 | Ht 66.0 in | Wt 271.0 lb

## 2020-09-11 DIAGNOSIS — E119 Type 2 diabetes mellitus without complications: Secondary | ICD-10-CM | POA: Diagnosis not present

## 2020-09-11 DIAGNOSIS — I2510 Atherosclerotic heart disease of native coronary artery without angina pectoris: Secondary | ICD-10-CM | POA: Diagnosis not present

## 2020-09-11 DIAGNOSIS — I6523 Occlusion and stenosis of bilateral carotid arteries: Secondary | ICD-10-CM | POA: Diagnosis not present

## 2020-09-11 DIAGNOSIS — I255 Ischemic cardiomyopathy: Secondary | ICD-10-CM

## 2020-09-11 NOTE — Patient Instructions (Signed)
Medication Instructions:  Your physician recommends that you continue on your current medications as directed. Please refer to the Current Medication list given to you today.  *If you need a refill on your cardiac medications before your next appointment, please call your pharmacy*   Lab Work: Your physician recommends that you return for lab work today: bmp   If you have labs (blood work) drawn today and your tests are completely normal, you will receive your results only by: Marland Kitchen MyChart Message (if you have MyChart) OR . A paper copy in the mail If you have any lab test that is abnormal or we need to change your treatment, we will call you to review the results.   Testing/Procedures: None   Follow-Up: At [facility], you and your health needs are our priority.  As part of our continuing mission to provide you with exceptional heart care, we have created designated Provider Care Teams.  These Care Teams include your primary Cardiologist (physician) and Advanced Practice Providers (APPs -  Physician Assistants and Nurse Practitioners) who all work together to provide you with the care you need, when you need it.  We recommend signing up for the patient portal called "MyChart".  Sign up information is provided on this After Visit Summary.  MyChart is used to connect with patients for Virtual Visits (Telemedicine).  Patients are able to view lab/test results, encounter notes, upcoming appointments, etc.  Non-urgent messages can be sent to your provider as well.   To learn more about what you can do with MyChart, go to [email or web address].    Your next appointment:   2 week(s)  The format for your next appointment:   In Person  Provider:   Jenne Campus, MD   Other Instructions

## 2020-09-12 DIAGNOSIS — I255 Ischemic cardiomyopathy: Secondary | ICD-10-CM

## 2020-09-12 HISTORY — DX: Ischemic cardiomyopathy: I25.5

## 2020-09-12 LAB — BASIC METABOLIC PANEL
BUN/Creatinine Ratio: 17 (ref 10–24)
BUN: 27 mg/dL (ref 8–27)
CO2: 25 mmol/L (ref 20–29)
Calcium: 8.9 mg/dL (ref 8.6–10.2)
Chloride: 92 mmol/L — ABNORMAL LOW (ref 96–106)
Creatinine, Ser: 1.63 mg/dL — ABNORMAL HIGH (ref 0.76–1.27)
Glucose: 71 mg/dL (ref 65–99)
Potassium: 5.1 mmol/L (ref 3.5–5.2)
Sodium: 135 mmol/L (ref 134–144)
eGFR: 47 mL/min/{1.73_m2} — ABNORMAL LOW (ref >59)
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
## 2020-09-17 ENCOUNTER — Telehealth: Payer: Self-pay

## 2020-09-17 DIAGNOSIS — E785 Hyperlipidemia, unspecified: Secondary | ICD-10-CM

## 2020-09-17 DIAGNOSIS — I10 Essential (primary) hypertension: Secondary | ICD-10-CM

## 2020-09-17 NOTE — Telephone Encounter (Signed)
Patient returning a call regarding lab results

## 2020-09-17 NOTE — Telephone Encounter (Signed)
-----   Message from Park Liter, MD sent at 09/17/2020 10:46 AM EDT ----- Labs reviewed, he does have some kidney dysfunction.  Which is worse than he was before.  I recommend to check his Chem-7 within a week

## 2020-09-17 NOTE — Telephone Encounter (Signed)
Patient notified of results and will come by in a week for Chem 7 per Dr. Agustin Cree. Order completed

## 2020-09-18 NOTE — Telephone Encounter (Signed)
Patient inquiry addressed

## 2020-09-25 ENCOUNTER — Encounter: Payer: Self-pay | Admitting: Cardiology

## 2020-09-25 ENCOUNTER — Other Ambulatory Visit: Payer: Self-pay

## 2020-09-25 ENCOUNTER — Ambulatory Visit (INDEPENDENT_AMBULATORY_CARE_PROVIDER_SITE_OTHER): Payer: Medicare Other | Admitting: Cardiology

## 2020-09-25 VITALS — BP 110/76 | HR 68 | Ht 66.0 in | Wt 265.0 lb

## 2020-09-25 DIAGNOSIS — I255 Ischemic cardiomyopathy: Secondary | ICD-10-CM

## 2020-09-25 DIAGNOSIS — I5043 Acute on chronic combined systolic (congestive) and diastolic (congestive) heart failure: Secondary | ICD-10-CM

## 2020-09-25 DIAGNOSIS — R0602 Shortness of breath: Secondary | ICD-10-CM

## 2020-09-25 DIAGNOSIS — I10 Essential (primary) hypertension: Secondary | ICD-10-CM | POA: Diagnosis not present
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
## 2020-09-25 NOTE — Patient Instructions (Signed)
Medication Instructions:  Your physician recommends that you continue on your current medications as directed. Please refer to the Current Medication list given to you today.  *If you need a refill on your cardiac medications before your next appointment, please call your pharmacy*   Lab Work: Your physician recommends that you return for lab work today: bmp, pro bnp   If you have labs (blood work) drawn today and your tests are completely normal, you will receive your results only by: Marland Kitchen MyChart Message (if you have MyChart) OR . A paper copy in the mail If you have any lab test that is abnormal or we need to change your treatment, we will call you to review the results.   Testing/Procedures: None   Follow-Up: At [facility], you and your health needs are our priority.  As part of our continuing mission to provide you with exceptional heart care, we have created designated Provider Care Teams.  These Care Teams include your primary Cardiologist (physician) and Advanced Practice Providers (APPs -  Physician Assistants and Nurse Practitioners) who all work together to provide you with the care you need, when you need it.  We recommend signing up for the patient portal called "MyChart".  Sign up information is provided on this After Visit Summary.  MyChart is used to connect with patients for Virtual Visits (Telemedicine).  Patients are able to view lab/test results, encounter notes, upcoming appointments, etc.  Non-urgent messages can be sent to your provider as well.   To learn more about what you can do with MyChart, go to [email or web address].    Your next appointment:   6 week(s)  The format for your next appointment:   In Person  Provider:   Jenne Campus, MD   Other Instructions

## 2020-09-26 ENCOUNTER — Other Ambulatory Visit: Payer: Self-pay | Admitting: Physician Assistant

## 2020-09-26 ENCOUNTER — Telehealth: Payer: Self-pay | Admitting: Emergency Medicine

## 2020-09-26 DIAGNOSIS — I2510 Atherosclerotic heart disease of native coronary artery without angina pectoris: Secondary | ICD-10-CM

## 2020-09-26 DIAGNOSIS — Z79899 Other long term (current) drug therapy: Secondary | ICD-10-CM

## 2020-09-26 LAB — BASIC METABOLIC PANEL
BUN/Creatinine Ratio: 14 (ref 10–24)
BUN: 20 mg/dL (ref 8–27)
CO2: 28 mmol/L (ref 20–29)
Calcium: 10 mg/dL (ref 8.6–10.2)
Chloride: 91 mmol/L — ABNORMAL LOW (ref 96–106)
Creatinine, Ser: 1.4 mg/dL — ABNORMAL HIGH (ref 0.76–1.27)
Glucose: 110 mg/dL — ABNORMAL HIGH (ref 65–99)
Potassium: 5.2 mmol/L (ref 3.5–5.2)
Sodium: 136 mmol/L (ref 134–144)
eGFR: 56 mL/min/{1.73_m2} — ABNORMAL LOW (ref >59)

## 2020-09-26 LAB — PRO B NATRIURETIC PEPTIDE: NT-Pro BNP: 69 pg/mL (ref 0–210)

## 2020-09-26 MED ORDER — METOPROLOL SUCCINATE ER 50 MG PO TB24: 50.0000 mg | ORAL_TABLET | Freq: Every day | ORAL | 2 refills | Status: DC

## 2020-09-26 MED ORDER — FUROSEMIDE 40 MG PO TABS: 60.0000 mg | ORAL_TABLET | Freq: Every day | ORAL | 2 refills | Status: DC

## 2020-09-26 MED ORDER — ROSUVASTATIN CALCIUM 40 MG PO TABS: 40.0000 mg | ORAL_TABLET | Freq: Every day | ORAL | 2 refills | Status: DC

## 2020-09-26 MED ORDER — LOSARTAN POTASSIUM 25 MG PO TABS: 25.0000 mg | ORAL_TABLET | Freq: Every day | ORAL | 2 refills | Status: AC

## 2020-09-26 MED ORDER — FUROSEMIDE 40 MG PO TABS
60.0000 mg | ORAL_TABLET | Freq: Every day | ORAL | 2 refills | Status: DC
Start: 2020-09-26 — End: 2020-11-07

## 2020-09-26 MED ORDER — CLOPIDOGREL BISULFATE 75 MG PO TABS: 75.0000 mg | ORAL_TABLET | Freq: Every day | ORAL | 3 refills | Status: DC

## 2020-09-26 MED ORDER — SPIRONOLACTONE 25 MG PO TABS: 25.0000 mg | ORAL_TABLET | Freq: Every day | ORAL | 2 refills | Status: DC

## 2020-09-26 NOTE — Telephone Encounter (Signed)
-----   Message from Park Liter, MD sent at 09/26/2020 12:18 PM EDT ----- Increase furosemide to 60 mg daily, Chem-7 in 2 weeks

## 2020-09-26 NOTE — Telephone Encounter (Signed)
Called patient informed him of lab results and to increase lasix to 60 mg daily and repeat labs in 2 weeks he understood no further questions.

## 2020-11-07 ENCOUNTER — Encounter: Payer: Self-pay | Admitting: Cardiology

## 2020-11-07 ENCOUNTER — Other Ambulatory Visit: Payer: Self-pay

## 2020-11-07 ENCOUNTER — Ambulatory Visit (INDEPENDENT_AMBULATORY_CARE_PROVIDER_SITE_OTHER): Payer: Medicare Other | Admitting: Cardiology

## 2020-11-07 VITALS — BP 110/62 | HR 79 | Ht 66.0 in | Wt 264.0 lb

## 2020-11-07 DIAGNOSIS — I6523 Occlusion and stenosis of bilateral carotid arteries: Secondary | ICD-10-CM

## 2020-11-07 DIAGNOSIS — E119 Type 2 diabetes mellitus without complications: Secondary | ICD-10-CM | POA: Diagnosis not present

## 2020-11-07 DIAGNOSIS — I5043 Acute on chronic combined systolic (congestive) and diastolic (congestive) heart failure: Secondary | ICD-10-CM | POA: Diagnosis not present

## 2020-11-07 DIAGNOSIS — I2510 Atherosclerotic heart disease of native coronary artery without angina pectoris: Secondary | ICD-10-CM

## 2020-11-07 NOTE — Patient Instructions (Signed)
Medication Instructions:  Your physician recommends that you continue on your current medications as directed. Please refer to the Current Medication list given to you today.  *If you need a refill on your cardiac medications before your next appointment, please call your pharmacy*   Lab Work: Your physician recommends that you return for lab work today: hemoglobin a1c, bmp   If you have labs (blood work) drawn today and your tests are completely normal, you will receive your results only by: Marland Kitchen MyChart Message (if you have MyChart) OR . A paper copy in the mail If you have any lab test that is abnormal or we need to change your treatment, we will call you to review the results.   Testing/Procedures: None   Follow-Up: At [facility], you and your health needs are our priority.  As part of our continuing mission to provide you with exceptional heart care, we have created designated Provider Care Teams.  These Care Teams include your primary Cardiologist (physician) and Advanced Practice Providers (APPs -  Physician Assistants and Nurse Practitioners) who all work together to provide you with the care you need, when you need it.  We recommend signing up for the patient portal called "MyChart".  Sign up information is provided on this After Visit Summary.  MyChart is used to connect with patients for Virtual Visits (Telemedicine).  Patients are able to view lab/test results, encounter notes, upcoming appointments, etc.  Non-urgent messages can be sent to your provider as well.   To learn more about what you can do with MyChart, go to [email or web address].    Your next appointment:   1 month(s)  The format for your next appointment:   In Person  Provider:   Jenne Campus, MD   Other Instructions
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
## 2020-11-08 LAB — BASIC METABOLIC PANEL
BUN/Creatinine Ratio: 17 (ref 10–24)
BUN: 23 mg/dL (ref 8–27)
CO2: 30 mmol/L — ABNORMAL HIGH (ref 20–29)
Calcium: 9.5 mg/dL (ref 8.6–10.2)
Chloride: 96 mmol/L (ref 96–106)
Creatinine, Ser: 1.32 mg/dL — ABNORMAL HIGH (ref 0.76–1.27)
Glucose: 82 mg/dL (ref 65–99)
Potassium: 5.3 mmol/L — ABNORMAL HIGH (ref 3.5–5.2)
Sodium: 140 mmol/L (ref 134–144)
eGFR: 61 mL/min/{1.73_m2} (ref >59)

## 2020-11-08 LAB — HEMOGLOBIN A1C
Est. average glucose Bld gHb Est-mCnc: 174 mg/dL
Hgb A1c MFr Bld: 7.7 % — ABNORMAL HIGH (ref 4.8–5.6)

## 2020-11-11 ENCOUNTER — Telehealth: Payer: Self-pay | Admitting: Cardiology

## 2020-11-11 NOTE — Telephone Encounter (Signed)
Pt came in requesting lab results, would like to be called.  Thank you!  Faxing lab results to pcp as well per pt request/kbl

## 2020-11-11 NOTE — Telephone Encounter (Signed)
Patient informed Dr. Agustin Cree has not reviewed results yet patient aware I will call him once Dr. Agustin Cree reviews.

## 2020-11-13 ENCOUNTER — Telehealth: Payer: Self-pay | Admitting: Emergency Medicine

## 2020-11-13 DIAGNOSIS — Z79899 Other long term (current) drug therapy: Secondary | ICD-10-CM

## 2020-11-13 NOTE — Telephone Encounter (Signed)
Called patient informed him of results. He will decrease spironolactone to 12.5mg  daily. He will repeat labs in 1 week No further questions.

## 2020-11-13 NOTE — Telephone Encounter (Signed)
-----   Message from Park Liter, MD sent at 11/12/2020  8:29 AM EDT ----- Labs acceptable, however potassium slightly high.  Please reduce spironolactone to only 12.5 mg once a day, Chem-7 need to be repeated in 1 week

## 2020-11-14 ENCOUNTER — Other Ambulatory Visit: Payer: Self-pay

## 2020-11-14 DIAGNOSIS — I255 Ischemic cardiomyopathy: Secondary | ICD-10-CM

## 2020-11-14 DIAGNOSIS — I739 Peripheral vascular disease, unspecified: Secondary | ICD-10-CM

## 2020-11-14 DIAGNOSIS — I10 Essential (primary) hypertension: Secondary | ICD-10-CM

## 2020-11-14 DIAGNOSIS — E785 Hyperlipidemia, unspecified: Secondary | ICD-10-CM

## 2020-11-22 LAB — CBC
Hematocrit: 47.5 % (ref 37.5–51.0)
Hemoglobin: 15.5 g/dL (ref 13.0–17.7)
MCH: 29.1 pg (ref 26.6–33.0)
MCHC: 32.6 g/dL (ref 31.5–35.7)
MCV: 89 fL (ref 79–97)
Platelets: 278 10*3/uL (ref 150–450)
RBC: 5.32 x10E6/uL (ref 4.14–5.80)
RDW: 14 % (ref 11.6–15.4)
WBC: 10.7 10*3/uL (ref 3.4–10.8)

## 2020-11-22 LAB — LIPID PANEL
Chol/HDL Ratio: 3.1 ratio (ref 0.0–5.0)
Cholesterol, Total: 120 mg/dL (ref 100–199)
HDL: 39 mg/dL — ABNORMAL LOW (ref >39)
LDL Chol Calc (NIH): 53 mg/dL (ref 0–99)
Triglycerides: 163 mg/dL — ABNORMAL HIGH (ref 0–149)
VLDL Cholesterol Cal: 28 mg/dL (ref 5–40)

## 2020-11-22 LAB — COMPREHENSIVE METABOLIC PANEL
ALT: 15 IU/L (ref 0–44)
AST: 14 IU/L (ref 0–40)
Albumin/Globulin Ratio: 1.5 (ref 1.2–2.2)
Albumin: 4.8 g/dL (ref 3.8–4.8)
Alkaline Phosphatase: 56 IU/L (ref 44–121)
BUN/Creatinine Ratio: 19 (ref 10–24)
BUN: 32 mg/dL — ABNORMAL HIGH (ref 8–27)
Bilirubin Total: 0.2 mg/dL (ref 0.0–1.2)
CO2: 27 mmol/L (ref 20–29)
Calcium: 9.7 mg/dL (ref 8.6–10.2)
Chloride: 91 mmol/L — ABNORMAL LOW (ref 96–106)
Creatinine, Ser: 1.69 mg/dL — ABNORMAL HIGH (ref 0.76–1.27)
Globulin, Total: 3.2 g/dL (ref 1.5–4.5)
Glucose: 155 mg/dL — ABNORMAL HIGH (ref 65–99)
Potassium: 5.5 mmol/L — ABNORMAL HIGH (ref 3.5–5.2)
Sodium: 138 mmol/L (ref 134–144)
Total Protein: 8 g/dL (ref 6.0–8.5)
eGFR: 45 mL/min/{1.73_m2} — ABNORMAL LOW (ref >59)

## 2020-11-22 LAB — TSH+T4F+T3FREE
Free T4: 1.45 ng/dL (ref 0.82–1.77)
T3, Free: 3.2 pg/mL (ref 2.0–4.4)
TSH: 2.98 u[IU]/mL (ref 0.450–4.500)

## 2020-11-25 ENCOUNTER — Telehealth: Payer: Self-pay

## 2020-11-25 DIAGNOSIS — I10 Essential (primary) hypertension: Secondary | ICD-10-CM

## 2020-11-25 DIAGNOSIS — E875 Hyperkalemia: Secondary | ICD-10-CM

## 2020-11-25 DIAGNOSIS — E785 Hyperlipidemia, unspecified: Secondary | ICD-10-CM

## 2020-11-25 NOTE — Telephone Encounter (Signed)
Spoke with Tammy per patient request, informed of the following results and recommendations. Patient aware to avoid lunch hours for blood work and no need of an appt. Order completed

## 2020-11-25 NOTE — Telephone Encounter (Signed)
-----   Message from Park Liter, MD sent at 11/25/2020  3:11 PM EDT ----- Potassium is still elevated, discontinue spironolactone, Chem-7 repeated in 10 days

## 2020-12-05 ENCOUNTER — Telehealth: Payer: Self-pay

## 2020-12-05 LAB — BASIC METABOLIC PANEL
BUN/Creatinine Ratio: 14 (ref 10–24)
BUN: 22 mg/dL (ref 8–27)
CO2: 29 mmol/L (ref 20–29)
Calcium: 9.3 mg/dL (ref 8.6–10.2)
Chloride: 92 mmol/L — ABNORMAL LOW (ref 96–106)
Creatinine, Ser: 1.55 mg/dL — ABNORMAL HIGH (ref 0.76–1.27)
Glucose: 152 mg/dL — ABNORMAL HIGH (ref 65–99)
Potassium: 4.8 mmol/L (ref 3.5–5.2)
Sodium: 135 mmol/L (ref 134–144)
eGFR: 50 mL/min/{1.73_m2} — ABNORMAL LOW (ref >59)

## 2020-12-05 NOTE — Telephone Encounter (Signed)
-----   Message from Park Liter, MD sent at 12/05/2020  1:05 PM EDT ----- Labs good, continue present management

## 2020-12-05 NOTE — Telephone Encounter (Signed)
Patient notified of results.

## 2020-12-10 ENCOUNTER — Other Ambulatory Visit: Payer: Self-pay

## 2020-12-10 ENCOUNTER — Encounter: Payer: Self-pay | Admitting: Cardiology

## 2020-12-10 ENCOUNTER — Ambulatory Visit (INDEPENDENT_AMBULATORY_CARE_PROVIDER_SITE_OTHER): Payer: Medicare Other | Admitting: Cardiology

## 2020-12-10 VITALS — BP 120/64 | HR 78 | Ht 66.0 in | Wt 263.0 lb

## 2020-12-10 DIAGNOSIS — I255 Ischemic cardiomyopathy: Secondary | ICD-10-CM

## 2020-12-10 DIAGNOSIS — E782 Mixed hyperlipidemia: Secondary | ICD-10-CM | POA: Diagnosis not present

## 2020-12-10 DIAGNOSIS — R0602 Shortness of breath: Secondary | ICD-10-CM | POA: Diagnosis not present

## 2020-12-10 DIAGNOSIS — I739 Peripheral vascular disease, unspecified: Secondary | ICD-10-CM | POA: Diagnosis not present
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
## 2020-12-10 NOTE — Patient Instructions (Signed)
Medication Instructions:  Your physician recommends that you continue on your current medications as directed. Please refer to the Current Medication list given to you today.  *If you need a refill on your cardiac medications before your next appointment, please call your pharmacy*   Lab Work: None If you have labs (blood work) drawn today and your tests are completely normal, you will receive your results only by: [place] (if you have MyChart) OR A paper copy in the mail If you have any lab test that is abnormal or we need to change your treatment, we will call you to review the results.   Testing/Procedures: none   Follow-Up: At [facility], you and your health needs are our priority.  As part of our continuing mission to provide you with exceptional heart care, we have created designated Provider Care Teams.  These Care Teams include your primary Cardiologist (physician) and Advanced Practice Providers (APPs -  Physician Assistants and Nurse Practitioners) who all work together to provide you with the care you need, when you need it.  We recommend signing up for the patient portal called "MyChart".  Sign up information is provided on this After Visit Summary.  MyChart is used to connect with patients for Virtual Visits (Telemedicine).  Patients are able to view lab/test results, encounter notes, upcoming appointments, etc.  Non-urgent messages can be sent to your provider as well.   To learn more about what you can do with MyChart, go to [email or web address].    Your next appointment:   2 month(s)  The format for your next appointment:   In Person  Provider:   Jenne Campus, MD   Other Instructions

## 2021-03-05 ENCOUNTER — Ambulatory Visit: Payer: Medicare Other | Admitting: Cardiology

## 2021-03-20 ENCOUNTER — Telehealth: Payer: Self-pay | Admitting: Cardiology

## 2021-03-20 DIAGNOSIS — I2510 Atherosclerotic heart disease of native coronary artery without angina pectoris: Secondary | ICD-10-CM

## 2021-03-20 MED ORDER — METOPROLOL SUCCINATE ER 50 MG PO TB24: 50.0000 mg | ORAL_TABLET | Freq: Every day | ORAL | 3 refills | Status: AC

## 2021-03-20 NOTE — Telephone Encounter (Signed)
Refill sent in per request.  

## 2021-03-20 NOTE — Telephone Encounter (Signed)
 *  STAT* If patient is at the pharmacy, call can be transferred to refill team.   1. Which medications need to be refilled? (please list name of each medication and dose if known)  metoprolol succinate (TOPROL-XL) 50 MG 24 hr tablet    2. Which pharmacy/location (including street and city if local pharmacy) is medication to be sent to? [place], [place]  3. Do they need a 30 day or 90 day supply? 90 days   Pt is out of meds, needs refill today

## 2021-03-23 ENCOUNTER — Other Ambulatory Visit: Payer: Self-pay

## 2021-03-24 ENCOUNTER — Encounter: Payer: Self-pay | Admitting: Cardiology

## 2021-03-24 ENCOUNTER — Other Ambulatory Visit: Payer: Self-pay

## 2021-03-24 ENCOUNTER — Ambulatory Visit (INDEPENDENT_AMBULATORY_CARE_PROVIDER_SITE_OTHER): Payer: Medicare Other | Admitting: Cardiology

## 2021-03-24 VITALS — BP 102/52 | HR 100 | Ht 65.0 in | Wt 255.8 lb

## 2021-03-24 DIAGNOSIS — I5043 Acute on chronic combined systolic (congestive) and diastolic (congestive) heart failure: Secondary | ICD-10-CM

## 2021-03-24 DIAGNOSIS — I2510 Atherosclerotic heart disease of native coronary artery without angina pectoris: Secondary | ICD-10-CM | POA: Diagnosis not present

## 2021-03-24 DIAGNOSIS — I10 Essential (primary) hypertension: Secondary | ICD-10-CM | POA: Diagnosis not present

## 2021-03-24 NOTE — Patient Instructions (Signed)
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
## 2021-04-28 DIAGNOSIS — M47816 Spondylosis without myelopathy or radiculopathy, lumbar region: Secondary | ICD-10-CM | POA: Insufficient documentation

## 2021-05-20 ENCOUNTER — Other Ambulatory Visit: Payer: Self-pay

## 2021-05-20 DIAGNOSIS — I2510 Atherosclerotic heart disease of native coronary artery without angina pectoris: Secondary | ICD-10-CM

## 2021-05-20 MED ORDER — CLOPIDOGREL BISULFATE 75 MG PO TABS: 75.0000 mg | ORAL_TABLET | Freq: Every day | ORAL | 3 refills | Status: AC

## 2021-11-03 ENCOUNTER — Ambulatory Visit (INDEPENDENT_AMBULATORY_CARE_PROVIDER_SITE_OTHER): Payer: Medicare Other | Admitting: Cardiology

## 2021-11-03 ENCOUNTER — Encounter: Payer: Self-pay | Admitting: Cardiology

## 2021-11-03 VITALS — BP 146/70 | HR 75 | Ht 66.0 in | Wt 251.2 lb

## 2021-11-03 DIAGNOSIS — I252 Old myocardial infarction: Secondary | ICD-10-CM

## 2021-11-03 DIAGNOSIS — R0609 Other forms of dyspnea: Secondary | ICD-10-CM

## 2021-11-03 DIAGNOSIS — E119 Type 2 diabetes mellitus without complications: Secondary | ICD-10-CM

## 2021-11-03 DIAGNOSIS — F17210 Nicotine dependence, cigarettes, uncomplicated: Secondary | ICD-10-CM

## 2021-11-03 DIAGNOSIS — I2510 Atherosclerotic heart disease of native coronary artery without angina pectoris: Secondary | ICD-10-CM

## 2021-11-03 DIAGNOSIS — I10 Essential (primary) hypertension: Secondary | ICD-10-CM | POA: Diagnosis not present

## 2021-11-03 NOTE — Addendum Note (Signed)
Addended by: Jacobo Forest D on: 11/03/2021 04:50 PM ? ? Modules accepted: Orders ? ?

## 2021-11-03 NOTE — Patient Instructions (Signed)
Medication Instructions:  Your physician recommends that you continue on your current medications as directed. Please refer to the Current Medication list given to you today.  *If you need a refill on your cardiac medications before your next appointment, please call your pharmacy*   Lab Work: None Ordered If you have labs (blood work) drawn today and your tests are completely normal, you will receive your results only by: MyChart Message (if you have MyChart) OR A paper copy in the mail If you have any lab test that is abnormal or we need to change your treatment, we will call you to review the results.   Testing/Procedures: Your physician has requested that you have an echocardiogram. Echocardiography is a painless test that uses sound waves to create images of your heart. It provides your doctor with information about the size and shape of your heart and how well your heart's chambers and valves are working. This procedure takes approximately one hour. There are no restrictions for this procedure.    Follow-Up: At [facility], you and your health needs are our priority.  As part of our continuing mission to provide you with exceptional heart care, we have created designated Provider Care Teams.  These Care Teams include your primary Cardiologist (physician) and Advanced Practice Providers (APPs -  Physician Assistants and Nurse Practitioners) who all work together to provide you with the care you need, when you need it.  We recommend signing up for the patient portal called "MyChart".  Sign up information is provided on this After Visit Summary.  MyChart is used to connect with patients for Virtual Visits (Telemedicine).  Patients are able to view lab/test results, encounter notes, upcoming appointments, etc.  Non-urgent messages can be sent to your provider as well.   To learn more about what you can do with MyChart, go to [email or web address].    Your next appointment:   5  month(s)  The format for your next appointment:   In Person  Provider:   Robert Krasowski, MD    Other Instructions NA  
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
## 2021-11-10 ENCOUNTER — Ambulatory Visit (INDEPENDENT_AMBULATORY_CARE_PROVIDER_SITE_OTHER): Payer: Medicare Other

## 2021-11-10 DIAGNOSIS — R0609 Other forms of dyspnea: Secondary | ICD-10-CM | POA: Diagnosis not present

## 2021-11-10 LAB — ECHOCARDIOGRAM COMPLETE
Area-P 1/2: 3.01 cm2
S' Lateral: 3.2 cm

## 2021-11-10 MED ORDER — PERFLUTREN LIPID MICROSPHERE
1.0000 mL | INTRAVENOUS | Status: AC | PRN
  Administered 2021-11-10: 2 mL via INTRAVENOUS

## 2021-11-13 DIAGNOSIS — Z9889 Other specified postprocedural states: Secondary | ICD-10-CM

## 2021-11-13 HISTORY — DX: Other specified postprocedural states: Z98.890

## 2021-12-30 ENCOUNTER — Telehealth: Payer: Self-pay

## 2021-12-30 NOTE — Telephone Encounter (Signed)
   [facility] Pre-operative Risk Assessment    Request for surgical clearance:  What type of surgery is being performed? Open Lumbar Laminectomy with medial facetectomies L3-4   When is this surgery scheduled? TBD   What type of clearance is required (medical clearance vs. Pharmacy clearance to hold med vs. Both)? Both  Are there any medications that need to be held prior to surgery and how long?Clopidogrel 75, length not specified   Practice name and name of physician performing surgery? Dr. Remo Lipps at [facility] is your office phone number: [phone]    3.   What is your office fax number: [phone]  8.   Anesthesia type (None, local, MAC, general) ? General Anesthesia    Basil Dess Seraj Dunnam 12/30/2021, 11:52 AM  _________________________________________________________________   (provider comments below)

## 2021-12-30 NOTE — Telephone Encounter (Signed)
   Name: Eddie Jones  DOB: December 28, 1955  MRN: [id number]  Primary Cardiologist: Jenne Campus, MD   Preoperative team, please contact this patient and set up a phone call appointment for further preoperative risk assessment. Please obtain consent and complete medication review. Thank you for your help.  I confirm that guidance regarding antiplatelet and oral anticoagulation therapy has been completed and, if necessary, noted below.  Per office protocol, patient may hold Plavix for 5 days prior to procedure.  Please resume Plavix as soon as possible postprocedure, at the discretion of the surgeon.  We recommend continuing aspirin throughout the perioperative period.  However, if aspirin needs to be held prior to procedure, please contact our office for further recommendations.  Lenna Sciara, NP 12/30/2021, 4:50 PM [facility] [address] [place], [place] [address]

## 2021-12-31 NOTE — Telephone Encounter (Signed)
I tried to call the pt to set up a tele visit, though vm is not set up and could not leave a message for call back.

## 2022-01-01 ENCOUNTER — Telehealth: Payer: Self-pay | Admitting: *Deleted

## 2022-01-01 NOTE — Telephone Encounter (Signed)
I s/w the pt and he is agreeable to plan of care for tele visit 01/06/22 @ 3 pm. Med rec and consent are done. Pt has also asked me to remove his daughter-in-law from the [facility]. I stated, the best thing I could do right now is to make a new note that he requested to remove his daughter-in-law [name]. He does not want any information given to her. I advised the pt that I will make a note of this, though when he is in the office the next time he will need to fill out a new DPR. Pt agreeable to plan of care.   Med rec and consent are done.

## 2022-01-01 NOTE — Telephone Encounter (Signed)
I s/w the pt and he is agreeable to plan of care for tele visit 01/06/22 @ 3 pm. Med rec and consent are done. Pt has also asked me to remove his daughter-in-law from the [facility]. I stated, the best thing I could do right now is to make a new note that he requested to remove his daughter-in-law Eddie Jones. He does not want any information given to her. I advised the pt that I will make a note of this, though when he is in the office the next time he will need to fill out a new DPR. Pt agreeable to plan of care.   Med rec and consent are done.     Patient Consent for Virtual Visit        Eddie Jones has provided verbal consent on 01/01/2022 for a virtual visit (video or telephone).   CONSENT FOR VIRTUAL VISIT FOR:  Eddie Jones  By participating in this virtual visit I agree to the following:  I hereby voluntarily request, consent and authorize [facility] and its employed or contracted physicians, physician assistants, nurse practitioners or other licensed health care professionals (the Practitioner), to provide me with telemedicine health care services (the "Services") as deemed necessary by the treating Practitioner. I acknowledge and consent to receive the Services by the Practitioner via telemedicine. I understand that the telemedicine visit will involve communicating with the Practitioner through live audiovisual communication technology and the disclosure of certain medical information by electronic transmission. I acknowledge that I have been given the opportunity to request an in-person assessment or other available alternative prior to the telemedicine visit and am voluntarily participating in the telemedicine visit.  I understand that I have the right to withhold or withdraw my consent to the use of telemedicine in the course of my care at any time, without affecting my right to future care or treatment, and that the Practitioner or I may terminate the telemedicine visit at any time. I  understand that I have the right to inspect all information obtained and/or recorded in the course of the telemedicine visit and may receive copies of available information for a reasonable fee.  I understand that some of the potential risks of receiving the Services via telemedicine include:  Delay or interruption in medical evaluation due to technological equipment failure or disruption; Information transmitted may not be sufficient (e.g. poor resolution of images) to allow for appropriate medical decision making by the Practitioner; and/or  In rare instances, security protocols could fail, causing a breach of personal health information.  Furthermore, I acknowledge that it is my responsibility to provide information about my medical history, conditions and care that is complete and accurate to the best of my ability. I acknowledge that Practitioner's advice, recommendations, and/or decision may be based on factors not within their control, such as incomplete or inaccurate data provided by me or distortions of diagnostic images or specimens that may result from electronic transmissions. I understand that the practice of medicine is not an exact science and that Practitioner makes no warranties or guarantees regarding treatment outcomes. I acknowledge that a copy of this consent can be made available to me via my patient portal ([facility]), or I can request a printed copy by calling the office of [facility].    I understand that my insurance will be billed for this visit.   I have read or had this consent read to me. I understand the contents of this consent, which adequately  explains the benefits and risks of the Services being provided via telemedicine.  I have been provided ample opportunity to ask questions regarding this consent and the Services and have had my questions answered to my satisfaction. I give my informed consent for the services to be provided through the use of  telemedicine in my medical care

## 2022-01-06 ENCOUNTER — Ambulatory Visit (INDEPENDENT_AMBULATORY_CARE_PROVIDER_SITE_OTHER): Payer: Medicare Other | Admitting: Nurse Practitioner

## 2022-01-06 ENCOUNTER — Encounter: Payer: Self-pay | Admitting: Nurse Practitioner

## 2022-01-06 DIAGNOSIS — Z01810 Encounter for preprocedural cardiovascular examination: Secondary | ICD-10-CM
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
## 2022-01-08 DIAGNOSIS — R52 Pain, unspecified: Secondary | ICD-10-CM | POA: Insufficient documentation

## 2022-01-08 HISTORY — DX: Pain, unspecified: R52

## 2022-01-11 ENCOUNTER — Telehealth: Payer: Self-pay | Admitting: Cardiology

## 2022-01-11 ENCOUNTER — Telehealth: Payer: Self-pay | Admitting: *Deleted

## 2022-01-11 NOTE — Telephone Encounter (Signed)
I s/w [name] with requesting office today. [name], stated she had not received the clearance notes yet. I assured her that we did send them 01/06/22 @ 3:13 pm per Christen Bame, NP..  I assured [name] I will be happy to re-send the clearance notes. Please see the Assessment and Plan section in the office for the clearance information and recommendations. [name] thanked me for the help today.

## 2022-01-11 NOTE — Telephone Encounter (Signed)
Calling to f/u on Medical Clearance. Please advise

## 2022-01-12 ENCOUNTER — Telehealth: Payer: Self-pay | Admitting: *Deleted

## 2022-01-12 NOTE — Telephone Encounter (Signed)
Eddie Jones from requesting office called yesterday stating she had not received the clearance notes yet. I added Christen Bame, NP onto the chat with [facility] as the NP had just recently seen the pt for pre op clearance. Christen Bame, NP assured Eddie Jones that she had faxed over her notes after the appt. Eddie Jones is calling back today stating still that she does not have the clearance notes. These notes may also have been sent to the surgeon's in basket as well. We have received confirmation on our end the notes have gone through. I will re-fax the clearance notes today using the old school way  by manual faxing.

## 2022-01-12 NOTE — Telephone Encounter (Signed)
[name] with [place] is following up again stating their office has not received clearance recommendation. She is requesting to have it re-faxed if possible.  Fax: [phone]

## 2022-01-12 NOTE — Telephone Encounter (Signed)
[name] from requesting office called yesterday stating she had not received the clearance notes yet. I added Christen Bame, NP onto the chat with [facility] as the NP had just recently seen the pt for pre op clearance. Christen Bame, NP assured [name] that she had faxed over her notes after the appt. [name] is calling back today stating still that she does not have the clearance notes. These notes may also have been sent to the surgeon's in basket as well. We have received confirmation on our end the notes have gone through. I will re-fax the clearance notes today using the old school way  by manual faxing.

## 2022-02-24 IMAGING — DX DG CHEST 2V
2 series · 2 of 2 positions shown · non-contrast
Comparison: Chest radiograph dated 08/19/2020.

CLINICAL DATA: 65-year-old male with shortness of breath.

EXAM:
CHEST - 2 VIEW

[w chest pa]
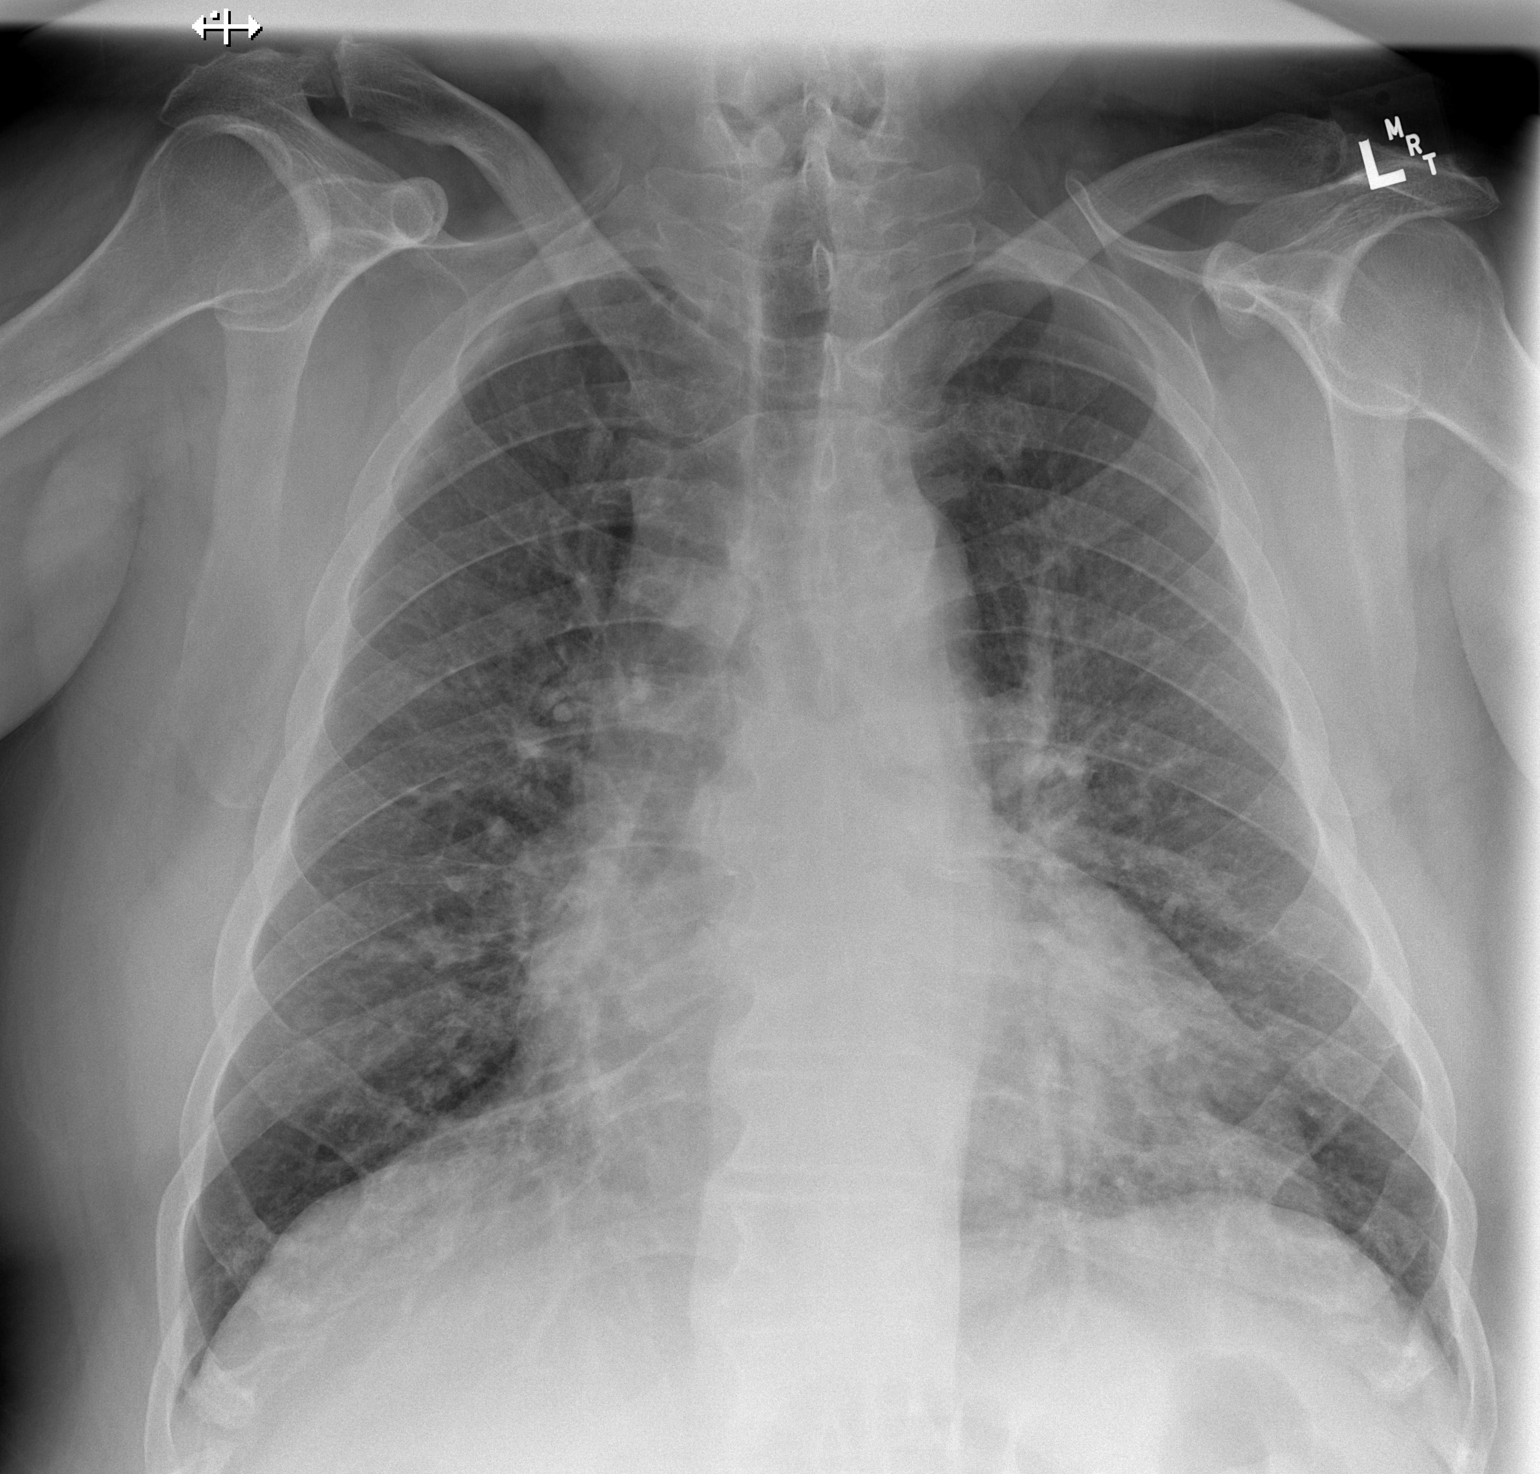

[w chest lat]
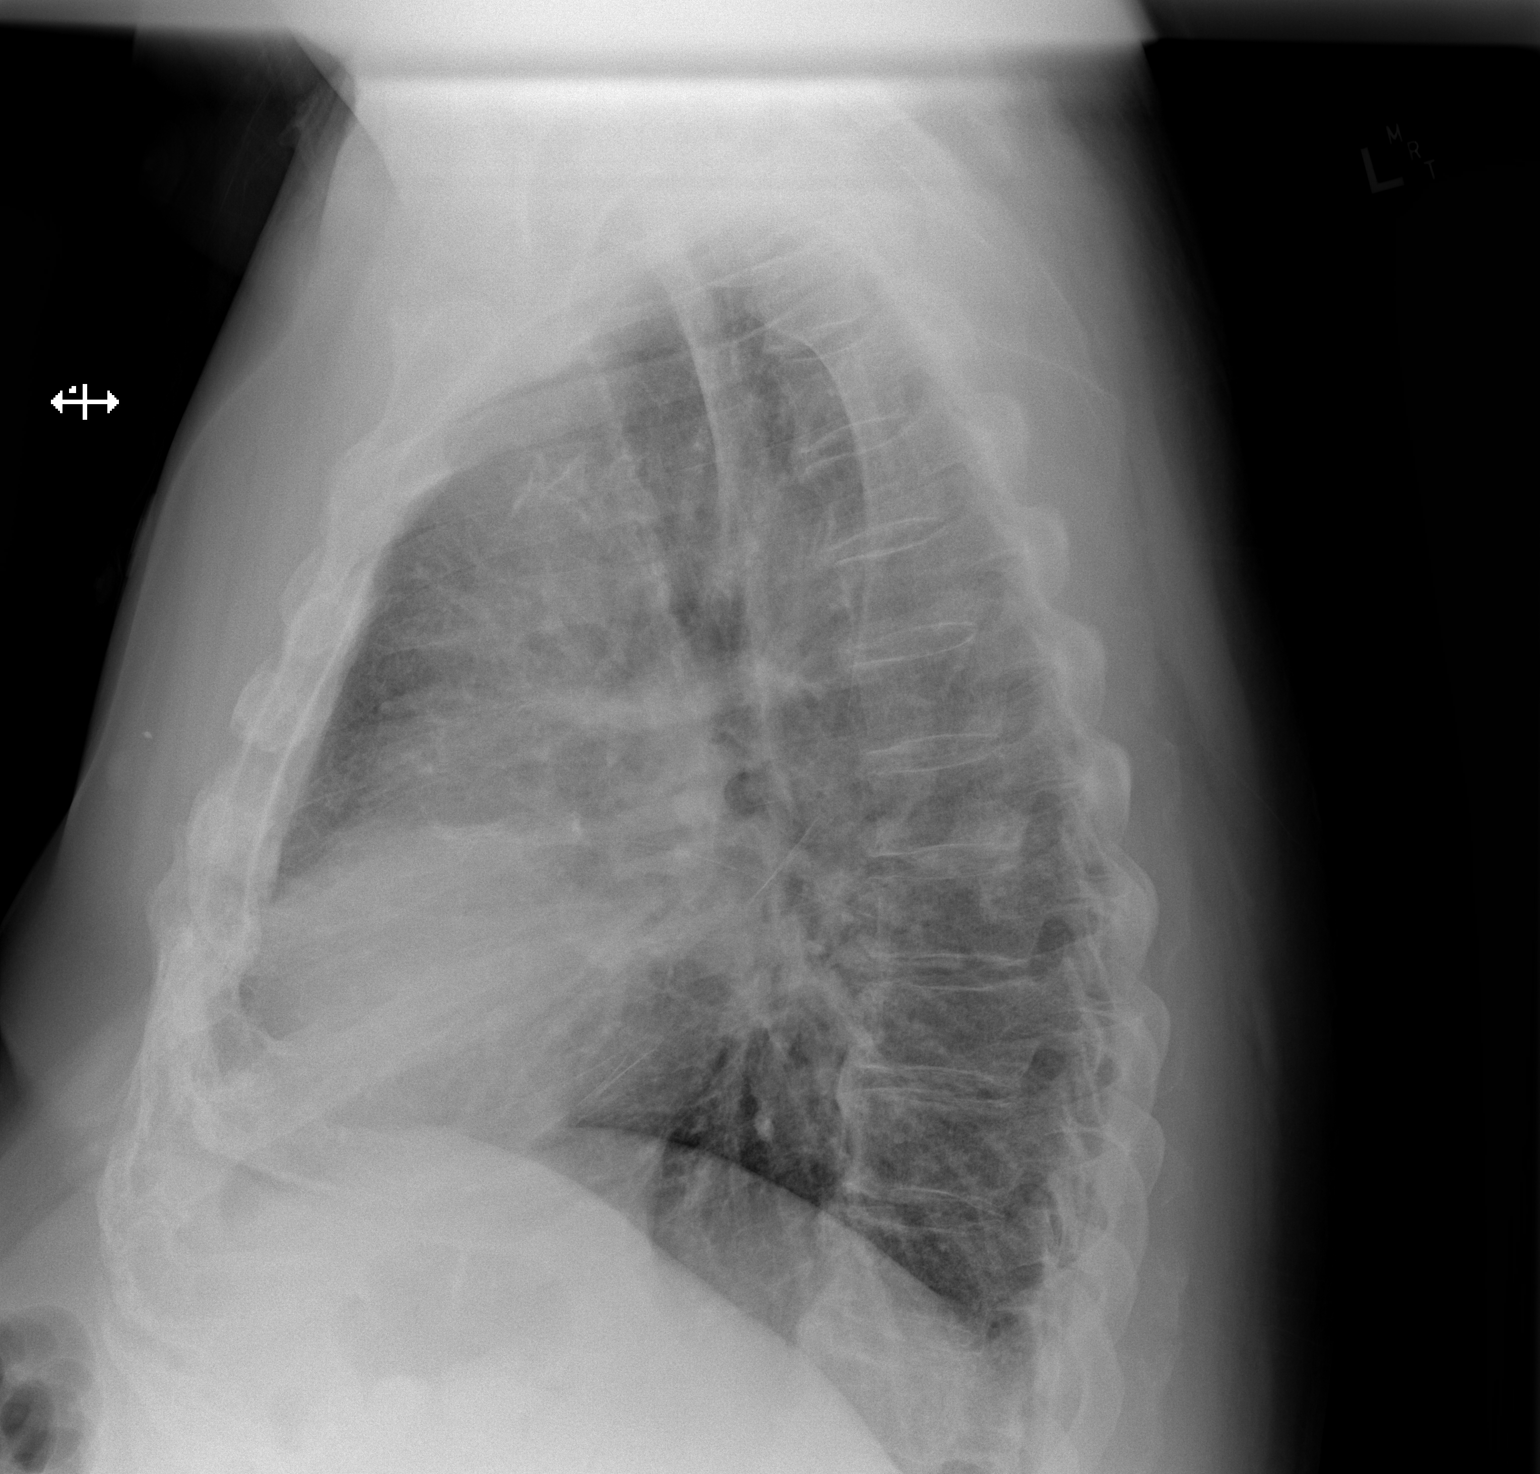

[2 of 2 positions shown; findings below may reference images not displayed]

FINDINGS: Cardiomegaly with vascular congestion similar or slightly increased
compared to prior radiograph. No focal consolidation, pleural
effusion, or pneumothorax. Degenerative changes of the spine. No
acute osseous pathology.
IMPRESSION: Cardiomegaly with mild vascular congestion.

## 2022-04-21 ENCOUNTER — Telehealth: Payer: Self-pay | Admitting: Cardiology

## 2022-04-21 NOTE — Telephone Encounter (Signed)
   Pre-operative Risk Assessment    Patient Name: Eddie Jones  DOB: 21-Jan-1956 MRN: [id number]      Request for Surgical Clearance    Procedure:   Open lumbar laminectomy L3-4  Date of Surgery:  Clearance 06/07/22                                 Surgeon:  Dr. Mindi Curling Surgeon's Group or Practice Name:  [facility]  Phone number:  [phone] Fax number:  [phone]   Type of Clearance Requested:   - Medical  - Pharmacy:  Hold Aspirin and Clopidogrel (Plavix) Their surgeon said he can remain on the aspirin    Type of Anesthesia:  General    Additional requests/questions:   They said patient was cleared back in July, the just wanted to make sure his clearance was still good as far as the plavix goes.   Marquita Palms   04/21/2022, 2:13 PM

## 2022-04-21 NOTE — Telephone Encounter (Signed)
   Patient Name: Eddie Jones  DOB: April 11, 1956 MRN: [id number]  Primary Cardiologist: Jenne Campus, MD  Chart reviewed as part of pre-operative protocol coverage. Mr.  Eddie Jones is maintained on DAPT ASA/Plavix due to DES 08/21/20. As he is >1 year from DES may hold Plavix 5 days prior to procedure per office protocols.   He has office visit with Dr. Agustin Cree  06/04/22. Medical clearance will be addressed at that time. Appt notes updated.   I will route this recommendation to the requesting party via Epic fax function and remove from pre-op pool.  Please call with questions.  Loel Dubonnet, NP 04/21/2022, 2:50 PM

## 2022-04-28 ENCOUNTER — Telehealth: Payer: Self-pay | Admitting: Cardiology

## 2022-04-28 NOTE — Telephone Encounter (Signed)
I s/w the pt and offered a sooner appt for pre op as he is going to need time to hold his Plavix. Procedure is scheduled for 06/07/22 and pt was scheduled for 06/04/22, though needs to hold Plavix x 5 days. Dr. Agustin Cree had an opening 05/12/22 @ 11:20. Pt thanked me for the help today. I will update the requesting office as well.

## 2022-04-28 NOTE — Telephone Encounter (Signed)
I will forward to pre op provider for further advice.

## 2022-04-28 NOTE — Telephone Encounter (Signed)
Follow Up:    Eddie Jones is calling to check on the status of patient's clearance. She saiys patient have an appointment on 06-04-22 and surgery is scheduled for 06-07-22. They will need to know about his Plavix before 06-04-22.

## 2022-05-04 ENCOUNTER — Encounter: Payer: Self-pay | Admitting: Cardiology

## 2022-05-04 NOTE — Telephone Encounter (Signed)
Caller stated she is following-up on the status of the patient's clearance.

## 2022-05-04 NOTE — Telephone Encounter (Signed)
Error

## 2022-05-04 NOTE — Telephone Encounter (Signed)
Pt has appt with Dr. Agustin Cree 05/12/22 for pre op clearance. Once the pt has been cleared Dr. Agustin Cree will have his nurse/cma fax over his ov notes giving clearance and any medication recommendations.

## 2022-05-12 ENCOUNTER — Ambulatory Visit: Payer: Medicare Other | Admitting: Cardiology

## 2022-05-17 ENCOUNTER — Encounter: Payer: Self-pay | Admitting: Cardiology

## 2022-05-17 ENCOUNTER — Ambulatory Visit: Payer: Medicare Other | Attending: Cardiology | Admitting: Cardiology

## 2022-05-17 VITALS — BP 122/64 | HR 64 | Ht 65.0 in | Wt 259.0 lb

## 2022-05-17 DIAGNOSIS — E119 Type 2 diabetes mellitus without complications: Secondary | ICD-10-CM | POA: Diagnosis not present

## 2022-05-17 DIAGNOSIS — I2510 Atherosclerotic heart disease of native coronary artery without angina pectoris: Secondary | ICD-10-CM

## 2022-05-17 DIAGNOSIS — I5043 Acute on chronic combined systolic (congestive) and diastolic (congestive) heart failure: Secondary | ICD-10-CM

## 2022-05-17 DIAGNOSIS — I255 Ischemic cardiomyopathy: Secondary | ICD-10-CM | POA: Diagnosis not present
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
## 2022-05-17 NOTE — Addendum Note (Signed)
Addended by: Jacobo Forest D on: 05/17/2022 10:43 AM   Modules accepted: Orders

## 2022-05-17 NOTE — Patient Instructions (Addendum)
Medication Instructions:  Your physician recommends that you continue on your current medications as directed. Please refer to the Current Medication list given to you today.  *If you need a refill on your cardiac medications before your next appointment, please call your pharmacy*   Lab Work: None Ordered If you have labs (blood work) drawn today and your tests are completely normal, you will receive your results only by: [place] (if you have MyChart) OR A paper copy in the mail If you have any lab test that is abnormal or we need to change your treatment, we will call you to review the results.   Testing/Procedures: Your physician has requested that you have a lexiscan myoview. For further information please visit [email or web address]. Please follow instruction sheet, as given.  The test will take approximately 3 to 4 hours to complete; you may bring reading material.  If someone comes with you to your appointment, they will need to remain in the main lobby due to limited space in the testing area. **If you are pregnant or breastfeeding, please notify the nuclear lab prior to your appointment**  How to prepare for your Myocardial Perfusion Test: Do not eat or drink 3 hours prior to your test, except you may have water. Do not consume products containing caffeine (regular or decaffeinated) 12 hours prior to your test. (ex: coffee, chocolate, sodas, tea). Do bring a list of your current medications with you.  If not listed below, you may take your medications as normal. Do wear comfortable clothes (no dresses or overalls) and walking shoes, tennis shoes preferred (No heels or open toe shoes are allowed). Do NOT wear cologne, perfume, aftershave, or lotions (deodorant is allowed). If these instructions are not followed, your test will have to be rescheduled.     Follow-Up: At [facility], you and your health needs are our priority.  As part of our continuing mission to provide  you with exceptional heart care, we have created designated Provider Care Teams.  These Care Teams include your primary Cardiologist (physician) and Advanced Practice Providers (APPs -  Physician Assistants and Nurse Practitioners) who all work together to provide you with the care you need, when you need it.  We recommend signing up for the patient portal called "MyChart".  Sign up information is provided on this After Visit Summary.  MyChart is used to connect with patients for Virtual Visits (Telemedicine).  Patients are able to view lab/test results, encounter notes, upcoming appointments, etc.  Non-urgent messages can be sent to your provider as well.   To learn more about what you can do with MyChart, go to [email or web address].    Your next appointment:   6 month(s)  The format for your next appointment:   In Person  Provider:   Jenne Campus, MD    Other Instructions NA

## 2022-05-18 ENCOUNTER — Telehealth (HOSPITAL_COMMUNITY): Payer: Self-pay | Admitting: *Deleted

## 2022-05-18 NOTE — Telephone Encounter (Signed)
Patient given detailed instructions per Myocardial Perfusion Study Information Sheet for the test on 05/19/22 Patient notified to arrive 15 minutes early and that it is imperative to arrive on time for appointment to keep from having the test rescheduled.  If you need to cancel or reschedule your appointment, please call the office within 24 hours of your appointment. . Patient verbalized understanding. Eddie Jones

## 2022-05-19 ENCOUNTER — Ambulatory Visit: Payer: Medicare Other | Attending: Cardiology

## 2022-05-19 DIAGNOSIS — I5043 Acute on chronic combined systolic (congestive) and diastolic (congestive) heart failure: Secondary | ICD-10-CM

## 2022-05-19 DIAGNOSIS — I255 Ischemic cardiomyopathy: Secondary | ICD-10-CM | POA: Diagnosis not present

## 2022-05-19 DIAGNOSIS — I2510 Atherosclerotic heart disease of native coronary artery without angina pectoris: Secondary | ICD-10-CM | POA: Diagnosis not present

## 2022-05-19 LAB — MYOCARDIAL PERFUSION IMAGING
LV dias vol: 98 mL (ref 62–150)
LV sys vol: 45 mL
Nuc Stress EF: 55 %
Peak HR: 81 {beats}/min
Rest HR: 61 {beats}/min
Rest Nuclear Isotope Dose: 10.8 mCi
SDS: 3
SRS: 1
SSS: 4
ST Depression (mm): 0 mm
Stress Nuclear Isotope Dose: 31.9 mCi
TID: 1.02

## 2022-05-19 MED ORDER — REGADENOSON 0.4 MG/5ML IV SOLN
0.4000 mg | Freq: Once | INTRAVENOUS | Status: AC
  Administered 2022-05-19: 0.4 mg via INTRAVENOUS

## 2022-05-19 MED ORDER — TECHNETIUM TC 99M TETROFOSMIN IV KIT
10.8000 | PACK | Freq: Once | INTRAVENOUS | Status: AC | PRN
  Administered 2022-05-19: 10.8 via INTRAVENOUS

## 2022-05-19 MED ORDER — TECHNETIUM TC 99M TETROFOSMIN IV KIT
31.9000 | PACK | Freq: Once | INTRAVENOUS | Status: AC | PRN
  Administered 2022-05-19: 31.9 via INTRAVENOUS

## 2022-05-21 DIAGNOSIS — I272 Pulmonary hypertension, unspecified: Secondary | ICD-10-CM

## 2022-05-21 HISTORY — DX: Pulmonary hypertension, unspecified: I27.20

## 2022-05-28 ENCOUNTER — Telehealth: Payer: Self-pay

## 2022-05-28 NOTE — Telephone Encounter (Signed)
Patient notified of results.

## 2022-05-28 NOTE — Telephone Encounter (Signed)
-----   Message from Park Liter, MD sent at 05/24/2022 12:39 PM EST ----- Stress test is normal

## 2022-06-04 ENCOUNTER — Ambulatory Visit: Payer: Medicare Other | Admitting: Cardiology

## 2022-06-09 ENCOUNTER — Telehealth: Payer: Self-pay

## 2022-06-09 NOTE — Telephone Encounter (Signed)
   [facility] Pre-operative Risk Assessment    Request for surgical clearance:  What type of surgery is being performed? Open Lumbar Laminectomy at L3-4  When is this surgery scheduled? 07/19/2022   What type of clearance is required (medical clearance vs. Pharmacy clearance to hold med vs. Both)? Both  Are there any medications that need to be held prior to surgery and how long?Plavix to hold before surgery   Practice name and name of physician performing surgery? Dr. Gerilyn Nestle at [facility]    What is your office phone number: [phone]    7.   What is your office fax number: [phone] Att: [name]  8.   Anesthesia type (None, local, MAC, general) ? General Anesthesia    Basil Dess Jessicaann Overbaugh 06/09/2022, 5:09 PM  _________________________________________________________________   (provider comments below)

## 2022-06-10 NOTE — Telephone Encounter (Signed)
   Primary Cardiologist: Jenne Campus, MD  Chart reviewed as part of pre-operative protocol coverage. He was seen by Dr. Agustin Cree on 05/17/22 and underwent nuclear stress testing due to his inability to achieve > 4 METS activity.  Based on ACC/AHA guidelines, Eddie Jones would be at acceptable risk for the planned procedure without further cardiovascular testing. His nuclear stress test was low risk with no evidence of ischemia.    Patient was advised that if he develops new symptoms prior to surgery to contact our office to arrange a follow-up appointment.  He verbalized understanding.  He may hold Plavix and aspirin for 5-7 days prior to procedure.   I will route this recommendation to the requesting party via Epic fax function and remove from pre-op pool.  Please call with questions.  Emmaline Life, NP-C  06/10/2022, 12:51 PM [address], [address] Office [phone] Fax [phone]

## 2022-07-14 NOTE — Telephone Encounter (Signed)
I have attempted to return patient call but the phone line was busy. Will try again later.

## 2022-07-14 NOTE — Telephone Encounter (Signed)
Pt's wife is calling back to see about getting stress test results sent to the surgeons office. She states that they have moved surgery date to tomorrow 01/18 at 2:30 P.M. and need this information as soon as possible.

## 2022-07-15 NOTE — Telephone Encounter (Signed)
Faxed over stress test results to [place].
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
## 2023-04-07 ENCOUNTER — Encounter: Payer: Self-pay | Admitting: Cardiology

## 2023-04-07 ENCOUNTER — Encounter: Payer: Self-pay | Admitting: *Deleted

## 2023-04-07 ENCOUNTER — Ambulatory Visit: Payer: 59 | Attending: Cardiology | Admitting: Cardiology

## 2023-04-07 VITALS — BP 116/73 | HR 72 | Ht 65.0 in | Wt 256.0 lb

## 2023-04-07 DIAGNOSIS — I2510 Atherosclerotic heart disease of native coronary artery without angina pectoris: Secondary | ICD-10-CM

## 2023-04-07 DIAGNOSIS — I255 Ischemic cardiomyopathy: Secondary | ICD-10-CM

## 2023-04-07 DIAGNOSIS — I10 Essential (primary) hypertension: Secondary | ICD-10-CM | POA: Diagnosis not present

## 2023-04-07 DIAGNOSIS — Z720 Tobacco use: Secondary | ICD-10-CM

## 2023-04-07 DIAGNOSIS — E785 Hyperlipidemia, unspecified: Secondary | ICD-10-CM

## 2023-04-07 DIAGNOSIS — F17210 Nicotine dependence, cigarettes, uncomplicated: Secondary | ICD-10-CM

## 2023-04-07 DIAGNOSIS — I6529 Occlusion and stenosis of unspecified carotid artery: Secondary | ICD-10-CM

## 2023-04-07 NOTE — Patient Instructions (Signed)
Medication Instructions:   *If you need a refill on your cardiac medications before your next appointment, please call your pharmacy*   Lab Work: CMET Direct LDL  If you have labs (blood work) drawn today and your tests are completely normal, you will receive your results only by: MyChart Message (if you have MyChart) OR A paper copy in the mail If you have any lab test that is abnormal or we need to change your treatment, we will call you to review the results.   Testing/Procedures: Carotid ultrasound    Follow-Up: At [facility], you and your health needs are our priority.  As part of our continuing mission to provide you with exceptional heart care, we have created designated Provider Care Teams.  These Care Teams include your primary Cardiologist (physician) and Advanced Practice Providers (APPs -  Physician Assistants and Nurse Practitioners) who all work together to provide you with the care you need, when you need it.  We recommend signing up for the patient portal called "MyChart".  Sign up information is provided on this After Visit Summary.  MyChart is used to connect with patients for Virtual Visits (Telemedicine).  Patients are able to view lab/test results, encounter notes, upcoming appointments, etc.  Non-urgent messages can be sent to your provider as well.   To learn more about what you can do with MyChart, go to [email or web address].    Your next appointment:   1 year(s)  Provider:   Gypsy Balsam, MD    Other Instructions

## 2023-04-12 ENCOUNTER — Other Ambulatory Visit: Payer: Self-pay

## 2023-04-12 DIAGNOSIS — I2510 Atherosclerotic heart disease of native coronary artery without angina pectoris: Secondary | ICD-10-CM

## 2023-04-12 LAB — COMPREHENSIVE METABOLIC PANEL WITH GFR
ALT: 11 IU/L (ref 0–44)
AST: 14 IU/L (ref 0–40)
Albumin: 4.3 g/dL (ref 3.9–4.9)
Alkaline Phosphatase: 65 IU/L (ref 44–121)
BUN/Creatinine Ratio: 19 (ref 10–24)
BUN: 23 mg/dL (ref 8–27)
Bilirubin Total: 0.2 mg/dL (ref 0.0–1.2)
CO2: 22 mmol/L (ref 20–29)
Calcium: 9.1 mg/dL (ref 8.6–10.2)
Chloride: 97 mmol/L (ref 96–106)
Creatinine, Ser: 1.24 mg/dL (ref 0.76–1.27)
Globulin, Total: 2.8 g/dL (ref 1.5–4.5)
Glucose: 118 mg/dL — ABNORMAL HIGH (ref 70–99)
Potassium: 4.6 mmol/L (ref 3.5–5.2)
Sodium: 136 mmol/L (ref 134–144)
Total Protein: 7.1 g/dL (ref 6.0–8.5)
eGFR: 65 mL/min/1.73

## 2023-04-12 LAB — LDL CHOLESTEROL, DIRECT: LDL Direct: 74 mg/dL (ref 0–99)

## 2023-04-12 MED ORDER — EZETIMIBE 10 MG PO TABS: 10.0000 mg | ORAL_TABLET | Freq: Every day | ORAL | 3 refills | Status: DC

## 2023-05-03 ENCOUNTER — Ambulatory Visit: Payer: 59 | Attending: Cardiology

## 2023-05-03 DIAGNOSIS — I2510 Atherosclerotic heart disease of native coronary artery without angina pectoris: Secondary | ICD-10-CM | POA: Diagnosis not present

## 2023-05-03 DIAGNOSIS — E785 Hyperlipidemia, unspecified: Secondary | ICD-10-CM | POA: Diagnosis not present

## 2023-05-03 DIAGNOSIS — I6529 Occlusion and stenosis of unspecified carotid artery: Secondary | ICD-10-CM | POA: Diagnosis not present

## 2023-09-06 ENCOUNTER — Telehealth: Payer: Self-pay

## 2023-09-06 NOTE — Telephone Encounter (Signed)
   Pre-operative Risk Assessment    Patient Name: BOCEPHUS CALI  DOB: [birth year] MRN: [id number] Date of last office visit: 04-07-23 Date of next office visit: 1 year   Request for Surgical Clearance    Procedure:   L3-4 Decompression  Date of Surgery:  Clearance 09/22/23                                Surgeon:  Oletha Cruel, MD Surgeon's Group or Practice Name:  [facility] Phone number:  [phone] Fax number:  [phone]  - Pharmacy:  Hold Aspirin and Clopidogrel (Plavix) -Surgeon requires 7 days pre-op and 5 days post op  Type of Anesthesia:  Not Indicated  Additional requests/questions:  Please fax a copy of EKG, Chest X-ray, labs, last office note  to the surgeon's office.  Signed, Neena Rhymes   09/06/2023, 2:25 PM

## 2023-09-06 NOTE — Telephone Encounter (Addendum)
 Dr. Bing Matter The patient needs lumbar spine surgery and the request is to hold aspirin and Plavix 7 days before and 5 days after.  He has a prior history of internal carotid artery stenting as well as coronary artery stenting.  He has a 30 mm stent in the mid RCA placed in February 2022.  Carotid US in 04/2023 with patent left ICA stent.  Please provide recommendations regarding holding aspirin and Plavix for the length of time outlined above.  Please route back to P CV DIV PREOP Tereso Newcomer, PA-C    09/06/2023 4:50 PM

## 2023-09-09 ENCOUNTER — Telehealth: Payer: Self-pay

## 2023-09-09 NOTE — Telephone Encounter (Signed)
 Called pt per Dr. Bing Matter to have LexiScan before Surgery. Pt stated that he had a stress test in [facility] today. He will try to get results faxed. Provided fax number.

## 2023-09-14 NOTE — Telephone Encounter (Signed)
 Spoke with pt and advised that he would need lexiScan before being cleared for surgery. He stated that he had one at [facility] and would have it faxed to [place]. We never received the fax. Notified pt again and he stated that he would call [facility] again and have it faxed.

## 2023-09-15 ENCOUNTER — Telehealth: Payer: Self-pay

## 2023-09-15 DIAGNOSIS — Z01810 Encounter for preprocedural cardiovascular examination: Secondary | ICD-10-CM

## 2023-09-15 NOTE — Telephone Encounter (Signed)
 Lexi Scan order for Pre op Eval per Dr. Vanetta Shawl note

## 2023-09-15 NOTE — Telephone Encounter (Signed)
 LexiScan for Pre Op Per Dr. Bing Matter

## 2023-09-15 NOTE — Addendum Note (Signed)
 Addended by: Baldo Ash D on: 09/15/2023 02:45 PM   Modules accepted: Orders

## 2023-09-20 ENCOUNTER — Telehealth: Payer: Self-pay | Admitting: Cardiology

## 2023-09-20 ENCOUNTER — Ambulatory Visit: Attending: Cardiology

## 2023-09-20 DIAGNOSIS — Z01810 Encounter for preprocedural cardiovascular examination: Secondary | ICD-10-CM

## 2023-09-20 MED ORDER — TECHNETIUM TC 99M TETROFOSMIN IV KIT
32.8000 | PACK | Freq: Once | INTRAVENOUS | Status: AC | PRN
  Administered 2023-09-20: 32.8 via INTRAVENOUS

## 2023-09-20 MED ORDER — TECHNETIUM TC 99M TETROFOSMIN IV KIT
10.8000 | PACK | Freq: Once | INTRAVENOUS | Status: AC | PRN
  Administered 2023-09-20: 10.8 via INTRAVENOUS

## 2023-09-20 MED ORDER — REGADENOSON 0.4 MG/5ML IV SOLN
0.4000 mg | Freq: Once | INTRAVENOUS | Status: AC
  Administered 2023-09-20: 0.4 mg via INTRAVENOUS

## 2023-09-20 NOTE — Telephone Encounter (Signed)
 Please notify p cv div preop when his stress test has been reviewed and whether he is cleared for surgery.  Thank you, Marcelino Duster

## 2023-09-20 NOTE — Telephone Encounter (Signed)
 Hospital calling in regards to test that was completed today for medical clearance. Hospital stated it has to be turned in by 3pm tomorrow in order for pt to have surgery on Thursday. Please advise

## 2023-09-21 ENCOUNTER — Telehealth: Payer: Self-pay | Admitting: Cardiology

## 2023-09-21 ENCOUNTER — Telehealth: Payer: Self-pay

## 2023-09-21 LAB — MYOCARDIAL PERFUSION IMAGING
LV dias vol: 111 mL (ref 62–150)
LV sys vol: 47 mL
Nuc Stress EF: 57 %
Peak HR: 74 {beats}/min
Rest HR: 61 {beats}/min
Rest Nuclear Isotope Dose: 10.8 mCi
SDS: 7
SRS: 0
SSS: 7
Stress Nuclear Isotope Dose: 32.8 mCi
TID: 1.32

## 2023-09-21 NOTE — Telephone Encounter (Signed)
 S/w pt's daughter is Marcelino Duster stated pt is cleared for tomorrow. Will remove from pre op pool.

## 2023-09-21 NOTE — Telephone Encounter (Signed)
 Daughter Marchelle Folks) stated she will need to confirm patient has clearance for surgery tomorrow by 2:00 pm today otherwise patient's surgery will be rescheduled.  Daughter wants a call back to confirm.

## 2023-09-21 NOTE — Telephone Encounter (Signed)
 Please call daughter to confirm he is cleared

## 2023-09-21 NOTE — Telephone Encounter (Signed)
 Follow Up:      She is calling to check on the status of patient's clearance. Patient is scheduled for surgery tomorrow(09-22-23). Please fax asap to [phone].

## 2023-09-21 NOTE — Telephone Encounter (Signed)
 Dr. Bing Matter asked that we fax a letter to [facility] explaining that the patient was cleared from a cardiac standpoint for his upcoming surgical procedure.  A cardiac clearance letter was faxed to [facility]. Called [facility] and they verified that they had received the cardiac clearance letter. They had no further questions at this time.

## 2023-09-22 ENCOUNTER — Telehealth: Payer: Self-pay

## 2023-09-22 NOTE — Telephone Encounter (Signed)
 Patient notified of results and verbalized understanding.

## 2023-09-22 NOTE — Telephone Encounter (Signed)
-----   Message from Gypsy Balsam sent at 09/22/2023  8:32 AM EDT ----- Stress test showing no ischemia

## 2023-09-26 NOTE — Telephone Encounter (Signed)
 Helping in preop today. Patient already had surgery, see follow-up phone notes from 3/26. Will remove from preop box.

## 2024-07-24 ENCOUNTER — Telehealth: Payer: Self-pay | Admitting: *Deleted

## 2024-07-24 DIAGNOSIS — R195 Other fecal abnormalities: Secondary | ICD-10-CM | POA: Insufficient documentation

## 2024-07-24 HISTORY — DX: Other fecal abnormalities: R19.5

## 2024-07-24 NOTE — Telephone Encounter (Signed)
"  ° °  Pre-operative Risk Assessment    Patient Name: Eddie Jones  DOB: 02-10-1956 MRN: [id number]      Request for Surgical Clearance    Procedure:  Colonoscopy  Date of Surgery:  Clearance 09/06/24                                 Surgeon:  Ozell BIRCH. Bert, MD, FACS Surgeon's Group or Practice Name:  [facility] [place] Phone number:  [phone] Fax number:  [phone]   Type of Clearance Requested:   - Medical  - Pharmacy:  Hold Aspirin  and Clopidogrel  (Plavix ) Please advise   Type of Anesthesia:  Propofol   Additional requests/questions:    Bonney Arloa Donovan Levorn   07/24/2024, 2:13 PM   "

## 2024-07-25 ENCOUNTER — Encounter: Payer: Self-pay | Admitting: *Deleted

## 2024-07-25 ENCOUNTER — Encounter: Payer: Self-pay | Admitting: Cardiology

## 2024-07-25 NOTE — Telephone Encounter (Signed)
 Patient has not been seen in our office in over 1 year (since 03/2023). Therefore, he will need an in-office visit. He already has an office visit scheduled with Dr. Bernie on 07/30/2024. Pre-op evaluation can be completed at that time. I will route this clearance form to Dr. Krasowski and add pre-op eval to upcoming appointment notes so that he is aware.   Urania Pearlman E Janett Kamath, PA-C 07/25/2024 10:37 AM

## 2024-07-30 ENCOUNTER — Ambulatory Visit: Admitting: Cardiology

## 2024-07-30 ENCOUNTER — Encounter: Payer: Self-pay | Admitting: Cardiology

## 2024-07-30 VITALS — BP 96/58 | HR 74 | Ht 66.0 in | Wt 224.2 lb

## 2024-07-30 DIAGNOSIS — Z01810 Encounter for preprocedural cardiovascular examination: Secondary | ICD-10-CM | POA: Diagnosis not present

## 2024-07-30 DIAGNOSIS — F17210 Nicotine dependence, cigarettes, uncomplicated: Secondary | ICD-10-CM

## 2024-07-30 DIAGNOSIS — I2510 Atherosclerotic heart disease of native coronary artery without angina pectoris: Secondary | ICD-10-CM

## 2024-07-30 DIAGNOSIS — E785 Hyperlipidemia, unspecified: Secondary | ICD-10-CM | POA: Diagnosis not present

## 2024-07-30 DIAGNOSIS — I255 Ischemic cardiomyopathy: Secondary | ICD-10-CM | POA: Diagnosis not present

## 2024-07-30 DIAGNOSIS — I10 Essential (primary) hypertension: Secondary | ICD-10-CM

## 2024-07-30 NOTE — Patient Instructions (Addendum)
 Medication Instructions:  Your physician recommends that you continue on your current medications as directed. Please refer to the Current Medication list given to you today.  *If you need a refill on your cardiac medications before your next appointment, please call your pharmacy*   Lab Work: Lipid profile, AST, & ALT If you have labs (blood work) drawn today and your tests are completely normal, you will receive your results only by: MyChart Message (if you have MyChart) OR A paper copy in the mail If you have any lab test that is abnormal or we need to change your treatment, we will call you to review the results.  Testing/Procedures: Your physician has requested that you have an echocardiogram. Echocardiography is a painless test that uses sound waves to create images of your heart. It provides your doctor with information about the size and shape of your heart and how well your hearts chambers and valves are working. This procedure takes approximately one hour. There are no restrictions for this procedure. Please do NOT wear cologne, perfume, aftershave, or lotions (deodorant is allowed). Please arrive 15 minutes prior to your appointment time.  Please note: We ask at that you not bring children with you during ultrasound (echo/ vascular) testing. Due to room size and safety concerns, children are not allowed in the ultrasound rooms during exams. Our front office staff cannot provide observation of children in our lobby area while testing is being conducted. An adult accompanying a patient to their appointment will only be allowed in the ultrasound room at the discretion of the ultrasound technician under special circumstances. We apologize for any inconvenience.  Follow-Up: At [facility], you and your health needs are our priority.  As part of our continuing mission to provide you with exceptional heart care, we have created designated Provider Care Teams.  These Care Teams include  your primary Cardiologist (physician) and Advanced Practice Providers (APPs -  Physician Assistants and Nurse Practitioners) who all work together to provide you with the care you need, when you need it.  We recommend signing up for the patient portal called MyChart.  Sign up information is provided on this After Visit Summary.  MyChart is used to connect with patients for Virtual Visits (Telemedicine).  Patients are able to view lab/test results, encounter notes, upcoming appointments, etc.  Non-urgent messages can be sent to your provider as well.   To learn more about what you can do with MyChart, go to [email or web address].    Your next appointment:   6 month(s)  The format for your next appointment:   In Person  Provider:   Lamar Fitch, MD   Other Instructions Echocardiogram An echocardiogram is a test that uses sound waves (ultrasound) to produce images of the heart. Images from an echocardiogram can provide important information about: Heart size and shape. The size and thickness and movement of your heart's walls. Heart muscle function and strength. Heart valve function or if you have stenosis. Stenosis is when the heart valves are too narrow. If blood is flowing backward through the heart valves (regurgitation). A tumor or infectious growth around the heart valves. Areas of heart muscle that are not working well because of poor blood flow or injury from a heart attack. Aneurysm detection. An aneurysm is a weak or damaged part of an artery wall. The wall bulges out from the normal force of blood pumping through the body. Tell a health care provider about: Any allergies you have. All medicines you are taking,  including vitamins, herbs, eye drops, creams, and over-the-counter medicines. Any blood disorders you have. Any surgeries you have had. Any medical conditions you have. Whether you are pregnant or may be pregnant. What are the risks? Generally, this is a safe  test. However, problems may occur, including an allergic reaction to dye (contrast) that may be used during the test. What happens before the test? No specific preparation is needed. You may eat and drink normally. What happens during the test? You will take off your clothes from the waist up and put on a hospital gown. Electrodes or electrocardiogram (ECG)patches may be placed on your chest. The electrodes or patches are then connected to a device that monitors your heart rate and rhythm. You will lie down on a table for an ultrasound exam. A gel will be applied to your chest to help sound waves pass through your skin. A handheld device, called a transducer, will be pressed against your chest and moved over your heart. The transducer produces sound waves that travel to your heart and bounce back (or echo back) to the transducer. These sound waves will be captured in real-time and changed into images of your heart that can be viewed on a video monitor. The images will be recorded on a computer and reviewed by your health care provider. You may be asked to change positions or hold your breath for a short time. This makes it easier to get different views or better views of your heart. In some cases, you may receive contrast through an IV in one of your veins. This can improve the quality of the pictures from your heart. The procedure may vary among health care providers and hospitals.   What can I expect after the test? You may return to your normal, everyday life, including diet, activities, and medicines, unless your health care provider tells you not to do that. Follow these instructions at home: It is up to you to get the results of your test. Ask your health care provider, or the department that is doing the test, when your results will be ready. Keep all follow-up visits. This is important. Summary An echocardiogram is a test that uses sound waves (ultrasound) to produce images of the  heart. Images from an echocardiogram can provide important information about the size and shape of your heart, heart muscle function, heart valve function, and other possible heart problems. You do not need to do anything to prepare before this test. You may eat and drink normally. After the echocardiogram is completed, you may return to your normal, everyday life, unless your health care provider tells you not to do that. This information is not intended to replace advice given to you by your health care provider. Make sure you discuss any questions you have with your health care provider. Document Revised: 02/05/2020 Document Reviewed: 02/05/2020 [employer] Patient Education  2021 [employer]   Important Information About Sugar

## 2024-07-31 LAB — LIPID PANEL
Chol/HDL Ratio: 2.5 ratio (ref 0.0–5.0)
Cholesterol, Total: 103 mg/dL (ref 100–199)
HDL: 42 mg/dL
LDL Chol Calc (NIH): 41 mg/dL (ref 0–99)
Triglycerides: 107 mg/dL (ref 0–149)
VLDL Cholesterol Cal: 20 mg/dL (ref 5–40)

## 2024-07-31 LAB — ALT: ALT: 21 [IU]/L (ref 0–44)

## 2024-07-31 LAB — AST: AST: 23 [IU]/L (ref 0–40)

## 2024-08-28 ENCOUNTER — Ambulatory Visit
